# Patient Record
Sex: Female | Born: 1935 | Race: White | Hispanic: No | Marital: Married | Smoking: Never smoker
Health system: Southern US, Community
[De-identification: ages and names within clinical notes are randomized; demographics above are authoritative.]

## PROBLEM LIST (undated history)

## (undated) DIAGNOSIS — I480 Paroxysmal atrial fibrillation: Secondary | ICD-10-CM

## (undated) DIAGNOSIS — E119 Type 2 diabetes mellitus without complications: Secondary | ICD-10-CM

## (undated) DIAGNOSIS — E785 Hyperlipidemia, unspecified: Secondary | ICD-10-CM

## (undated) DIAGNOSIS — M1990 Unspecified osteoarthritis, unspecified site: Secondary | ICD-10-CM

## (undated) DIAGNOSIS — Z789 Other specified health status: Secondary | ICD-10-CM

## (undated) DIAGNOSIS — I340 Nonrheumatic mitral (valve) insufficiency: Secondary | ICD-10-CM

## (undated) DIAGNOSIS — C189 Malignant neoplasm of colon, unspecified: Secondary | ICD-10-CM

## (undated) DIAGNOSIS — F419 Anxiety disorder, unspecified: Secondary | ICD-10-CM

## (undated) DIAGNOSIS — C50919 Malignant neoplasm of unspecified site of unspecified female breast: Secondary | ICD-10-CM

## (undated) DIAGNOSIS — I2510 Atherosclerotic heart disease of native coronary artery without angina pectoris: Secondary | ICD-10-CM

## (undated) DIAGNOSIS — I10 Essential (primary) hypertension: Secondary | ICD-10-CM

## (undated) HISTORY — DX: Malignant neoplasm of unspecified site of unspecified female breast: C50.919

## (undated) HISTORY — DX: Paroxysmal atrial fibrillation: I48.0

## (undated) HISTORY — DX: Anxiety disorder, unspecified: F41.9

## (undated) HISTORY — PX: APPENDECTOMY: SHX54

## (undated) HISTORY — DX: Other specified health status: Z78.9

## (undated) HISTORY — PX: TONSILLECTOMY: SHX5217

## (undated) HISTORY — DX: Malignant neoplasm of colon, unspecified: C18.9

## (undated) HISTORY — PX: CHOLECYSTECTOMY: SHX55

## (undated) HISTORY — DX: Hyperlipidemia, unspecified: E78.5

## (undated) HISTORY — DX: Type 2 diabetes mellitus without complications: E11.9

## (undated) HISTORY — PX: TONSILLECTOMY: SUR1361

## (undated) HISTORY — DX: Essential (primary) hypertension: I10

## (undated) HISTORY — DX: Atherosclerotic heart disease of native coronary artery without angina pectoris: I25.10

## (undated) HISTORY — PX: OTHER SURGICAL HISTORY: SHX169

## (undated) HISTORY — PX: CATARACT EXTRACTION W/ INTRAOCULAR LENS  IMPLANT, BILATERAL: SHX1307

## (undated) HISTORY — DX: Nonrheumatic mitral (valve) insufficiency: I34.0

---

## 1986-02-25 DIAGNOSIS — C50919 Malignant neoplasm of unspecified site of unspecified female breast: Secondary | ICD-10-CM

## 1986-02-25 HISTORY — DX: Malignant neoplasm of unspecified site of unspecified female breast: C50.919

## 1986-02-25 HISTORY — PX: BREAST LUMPECTOMY: SHX2

## 1989-02-25 DIAGNOSIS — C189 Malignant neoplasm of colon, unspecified: Secondary | ICD-10-CM

## 1989-02-25 HISTORY — DX: Malignant neoplasm of colon, unspecified: C18.9

## 1989-02-25 HISTORY — PX: COLON SURGERY: SHX602

## 2002-11-01 ENCOUNTER — Emergency Department (HOSPITAL_COMMUNITY): Admission: EM | Admit: 2002-11-01 | Discharge: 2002-11-01 | Payer: Self-pay | Admitting: Emergency Medicine

## 2002-11-08 ENCOUNTER — Ambulatory Visit (HOSPITAL_COMMUNITY): Admission: RE | Admit: 2002-11-08 | Discharge: 2002-11-08 | Payer: Self-pay | Admitting: Internal Medicine

## 2002-11-15 ENCOUNTER — Encounter: Payer: Self-pay | Admitting: Internal Medicine

## 2002-11-15 ENCOUNTER — Ambulatory Visit (HOSPITAL_COMMUNITY): Admission: RE | Admit: 2002-11-15 | Discharge: 2002-11-15 | Payer: Self-pay | Admitting: Internal Medicine

## 2003-02-26 HISTORY — PX: CARDIAC CATHETERIZATION: SHX172

## 2003-06-07 ENCOUNTER — Inpatient Hospital Stay (HOSPITAL_COMMUNITY): Admission: AD | Admit: 2003-06-07 | Discharge: 2003-06-08 | Payer: Self-pay | Admitting: Cardiology

## 2004-06-22 ENCOUNTER — Ambulatory Visit: Payer: Self-pay | Admitting: Cardiology

## 2007-03-26 ENCOUNTER — Ambulatory Visit: Payer: Self-pay | Admitting: Cardiology

## 2007-04-05 ENCOUNTER — Ambulatory Visit: Payer: Self-pay | Admitting: Cardiology

## 2007-04-27 ENCOUNTER — Encounter: Payer: Self-pay | Admitting: Cardiology

## 2007-05-04 ENCOUNTER — Encounter: Payer: Self-pay | Admitting: Physician Assistant

## 2007-05-04 ENCOUNTER — Ambulatory Visit: Payer: Self-pay | Admitting: Cardiology

## 2007-05-07 ENCOUNTER — Ambulatory Visit: Payer: Self-pay | Admitting: Cardiology

## 2007-05-08 ENCOUNTER — Ambulatory Visit: Payer: Self-pay | Admitting: Cardiology

## 2007-05-08 ENCOUNTER — Encounter: Payer: Self-pay | Admitting: Cardiology

## 2007-05-13 ENCOUNTER — Ambulatory Visit: Payer: Self-pay | Admitting: Cardiology

## 2007-05-20 ENCOUNTER — Ambulatory Visit: Payer: Self-pay | Admitting: Cardiology

## 2007-05-27 ENCOUNTER — Ambulatory Visit: Payer: Self-pay | Admitting: Cardiology

## 2007-06-01 ENCOUNTER — Ambulatory Visit: Payer: Self-pay | Admitting: Cardiology

## 2007-06-29 ENCOUNTER — Ambulatory Visit: Payer: Self-pay | Admitting: Cardiology

## 2007-07-27 ENCOUNTER — Ambulatory Visit: Payer: Self-pay | Admitting: Cardiology

## 2007-08-25 ENCOUNTER — Ambulatory Visit: Payer: Self-pay | Admitting: Cardiology

## 2007-09-22 ENCOUNTER — Ambulatory Visit: Payer: Self-pay | Admitting: Cardiology

## 2007-10-21 ENCOUNTER — Ambulatory Visit: Payer: Self-pay | Admitting: Cardiology

## 2007-11-24 ENCOUNTER — Ambulatory Visit: Payer: Self-pay | Admitting: Cardiology

## 2007-12-08 ENCOUNTER — Ambulatory Visit: Payer: Self-pay | Admitting: Cardiology

## 2007-12-29 ENCOUNTER — Ambulatory Visit: Payer: Self-pay | Admitting: Cardiology

## 2008-01-29 ENCOUNTER — Ambulatory Visit: Payer: Self-pay | Admitting: Cardiology

## 2008-02-23 ENCOUNTER — Ambulatory Visit: Payer: Self-pay | Admitting: Cardiology

## 2008-03-25 ENCOUNTER — Ambulatory Visit: Payer: Self-pay | Admitting: Cardiology

## 2008-04-15 ENCOUNTER — Encounter: Payer: Self-pay | Admitting: Physician Assistant

## 2008-04-15 ENCOUNTER — Ambulatory Visit: Payer: Self-pay | Admitting: Cardiology

## 2008-04-22 ENCOUNTER — Ambulatory Visit: Payer: Self-pay | Admitting: Cardiology

## 2008-05-06 ENCOUNTER — Ambulatory Visit: Payer: Self-pay | Admitting: Cardiology

## 2008-05-10 ENCOUNTER — Encounter: Payer: Self-pay | Admitting: Cardiology

## 2008-06-07 ENCOUNTER — Ambulatory Visit: Payer: Self-pay | Admitting: Cardiology

## 2008-06-24 ENCOUNTER — Ambulatory Visit: Payer: Self-pay | Admitting: Cardiology

## 2008-07-19 ENCOUNTER — Ambulatory Visit: Payer: Self-pay | Admitting: Cardiology

## 2008-08-09 ENCOUNTER — Ambulatory Visit: Payer: Self-pay | Admitting: Cardiology

## 2008-09-02 ENCOUNTER — Ambulatory Visit: Payer: Self-pay | Admitting: Cardiology

## 2008-09-30 ENCOUNTER — Ambulatory Visit: Payer: Self-pay | Admitting: Cardiology

## 2008-10-10 ENCOUNTER — Encounter: Payer: Self-pay | Admitting: *Deleted

## 2008-10-26 ENCOUNTER — Ambulatory Visit: Payer: Self-pay | Admitting: Cardiology

## 2008-11-22 ENCOUNTER — Ambulatory Visit: Payer: Self-pay | Admitting: Cardiology

## 2008-11-22 LAB — CONVERTED CEMR LAB: POC INR: 2.5

## 2008-12-20 ENCOUNTER — Ambulatory Visit: Payer: Self-pay | Admitting: Cardiology

## 2009-01-13 ENCOUNTER — Ambulatory Visit: Payer: Self-pay | Admitting: Cardiology

## 2009-02-07 ENCOUNTER — Ambulatory Visit: Payer: Self-pay | Admitting: Cardiology

## 2009-02-07 LAB — CONVERTED CEMR LAB: POC INR: 2.6

## 2009-03-10 ENCOUNTER — Ambulatory Visit: Payer: Self-pay | Admitting: Cardiology

## 2009-03-10 LAB — CONVERTED CEMR LAB: POC INR: 2.8

## 2009-04-11 ENCOUNTER — Ambulatory Visit: Payer: Self-pay | Admitting: Cardiology

## 2009-04-25 ENCOUNTER — Ambulatory Visit: Payer: Self-pay | Admitting: Cardiology

## 2009-04-25 DIAGNOSIS — Z8679 Personal history of other diseases of the circulatory system: Secondary | ICD-10-CM | POA: Insufficient documentation

## 2009-04-25 DIAGNOSIS — E119 Type 2 diabetes mellitus without complications: Secondary | ICD-10-CM

## 2009-04-25 DIAGNOSIS — I10 Essential (primary) hypertension: Secondary | ICD-10-CM

## 2009-04-25 DIAGNOSIS — E785 Hyperlipidemia, unspecified: Secondary | ICD-10-CM

## 2009-05-09 ENCOUNTER — Ambulatory Visit: Payer: Self-pay | Admitting: Cardiology

## 2009-06-05 ENCOUNTER — Encounter: Payer: Self-pay | Admitting: Cardiology

## 2009-06-06 ENCOUNTER — Ambulatory Visit: Payer: Self-pay | Admitting: Cardiology

## 2009-06-06 LAB — CONVERTED CEMR LAB: POC INR: 2.8

## 2009-07-04 ENCOUNTER — Ambulatory Visit: Payer: Self-pay | Admitting: Cardiology

## 2009-07-13 ENCOUNTER — Encounter: Payer: Self-pay | Admitting: Cardiology

## 2009-08-01 ENCOUNTER — Ambulatory Visit: Payer: Self-pay | Admitting: Cardiology

## 2009-08-25 ENCOUNTER — Ambulatory Visit: Payer: Self-pay | Admitting: Cardiology

## 2009-09-22 ENCOUNTER — Ambulatory Visit: Payer: Self-pay | Admitting: Cardiology

## 2009-09-22 LAB — CONVERTED CEMR LAB: POC INR: 2.9

## 2009-10-20 ENCOUNTER — Ambulatory Visit: Payer: Self-pay | Admitting: Cardiology

## 2009-10-20 LAB — CONVERTED CEMR LAB: POC INR: 2.7

## 2009-10-31 ENCOUNTER — Telehealth (INDEPENDENT_AMBULATORY_CARE_PROVIDER_SITE_OTHER): Payer: Self-pay | Admitting: *Deleted

## 2009-10-31 ENCOUNTER — Encounter: Payer: Self-pay | Admitting: Cardiology

## 2009-10-31 ENCOUNTER — Ambulatory Visit: Payer: Self-pay | Admitting: Cardiovascular Disease

## 2009-10-31 ENCOUNTER — Emergency Department (HOSPITAL_COMMUNITY): Admission: EM | Admit: 2009-10-31 | Discharge: 2009-10-31 | Payer: Self-pay | Admitting: Emergency Medicine

## 2009-11-08 ENCOUNTER — Encounter (INDEPENDENT_AMBULATORY_CARE_PROVIDER_SITE_OTHER): Payer: Self-pay | Admitting: *Deleted

## 2009-11-17 ENCOUNTER — Ambulatory Visit: Payer: Self-pay | Admitting: Cardiology

## 2009-11-17 LAB — CONVERTED CEMR LAB: POC INR: 3.3

## 2009-12-15 ENCOUNTER — Ambulatory Visit: Payer: Self-pay | Admitting: Cardiology

## 2009-12-15 LAB — CONVERTED CEMR LAB: POC INR: 3.2

## 2010-01-09 ENCOUNTER — Encounter: Admission: RE | Admit: 2010-01-09 | Discharge: 2010-01-09 | Payer: Self-pay | Admitting: Otolaryngology

## 2010-01-12 ENCOUNTER — Ambulatory Visit: Payer: Self-pay | Admitting: Cardiology

## 2010-01-12 LAB — CONVERTED CEMR LAB: POC INR: 2.9

## 2010-02-09 ENCOUNTER — Ambulatory Visit: Payer: Self-pay | Admitting: Cardiology

## 2010-03-08 ENCOUNTER — Ambulatory Visit: Admission: RE | Admit: 2010-03-08 | Discharge: 2010-03-08 | Payer: Self-pay | Source: Home / Self Care

## 2010-03-17 ENCOUNTER — Encounter: Payer: Self-pay | Admitting: Otolaryngology

## 2010-03-27 NOTE — Assessment & Plan Note (Signed)
Summary: 1 yr fu per feb reminder-srs   Visit Type:  Follow-up Primary Provider:  Dimas Aguas  CC:  follow-up visit.  History of Present Illness: the patient is an 77 year old female with a history of paroxysmal atrial fibrillation and atrial flutter.  The patient is on Coumadin therapy.shows normal LV function and nonobstructive coronary artery disease.  She had a normal adenosine Cardiolite study with an ejection fraction is 64% in March of 2009.  The patient has diabetes and hypertension.  The patient is intolerant to numerous statins as well as edea.  The patient status has occasional palpitations but there very brief in duration.  She denies any chest pain shortness of breath orthopnea PND she reports no dizziness presyncope or syncope.  The patient reports a Dr. Dimas Aguas falls her blood work including her cholesterol panel.  Preventive Screening-Counseling & Management  Alcohol-Tobacco     Smoking Status: never  Current Medications (verified): 1)  Diltiazem Hcl Er Beads 180 Mg Xr24h-Cap (Diltiazem Hcl Er Beads) .... Take 1 Tablet By Mouth Once A Day 2)  Coumadin 2.5 Mg Tabs (Warfarin Sodium) .... Take 1 Tablet By Mouth As Directed 3)  Glipizide 5 Mg Xr24h-Tab (Glipizide) .... Take 1 Tablet By Mouth Once A Day 4)  Triamterene-Hctz 37.5-25 Mg Tabs (Triamterene-Hctz) .... Take 1 Tablet By Mouth Once A Day 5)  Diazepam 5 Mg Tabs (Diazepam) .... Take 1 Tablet By Mouth Once A Day 6)  Vitamin D3 400 Unit Tabs (Cholecalciferol) .... Take 1 Tablet By Mouth Once A Day  Allergies (verified): 1)  ! Accupril 2)  ! Bactrim 3)  ! Epinephrine 4)  ! Inderal 5)  ! Pcn 6)  ! Xanax 7)  ! Zithromax 8)  ! * Polysorin 9)  ! Lipitor (Atorvastatin) 10)  ! * Statins 11)  ! Cephalexin  Comments:  Nurse/Medical Assistant: The patient's medications and allergies were reviewed with the patient and were updated in the Medication and Allergy Lists. Bottles reviewed.  Clinical Review Panels:  Cardiac  Imaging Cardiac Cath Findings nonobstructive coronary artery disease; 20% distal CFX normal left ventricular function (06/08/2003)    Past History:  Past Medical History: Last updated: 04/15/2008 paroxysmal atrial fibrillation chronic Coumadin A.  CHAD2: 3 hypertension type 2 diabetes mellitus normal left ventricular function mild mitral regurgitation nonobstructive coronary artery disease dyslipidemia A.  intolerant to numerous statins, and Zetia breast cancer, 1988 A.  status post right lumpectomy colon cancer, 1991 A.  status post right hemicolectomy generalized anxiety disorder  Past Surgical History: Last updated: 04/15/2008 status post right hemicolectomy, 1991 status post right breast lumpectomy, 1988 status post laparoscopic cholecystectomy status post status post tonsillectomy status post bilateral intraocular lens implant  Family History: Last updated: 04/15/2008 Family History of Coronary Artery Disease:   Social History: Last updated: 04/15/2008 Tobacco Use - No.  Alcohol Use - no  Risk Factors: Smoking Status: never (04/25/2009)  Review of Systems       The patient complains of palpitations.  The patient denies fatigue, malaise, fever, weight gain/loss, vision loss, decreased hearing, hoarseness, chest pain, shortness of breath, prolonged cough, wheezing, sleep apnea, coughing up blood, abdominal pain, blood in stool, nausea, vomiting, diarrhea, heartburn, incontinence, blood in urine, muscle weakness, joint pain, leg swelling, rash, skin lesions, headache, fainting, dizziness, depression, anxiety, enlarged lymph nodes, easy bruising or bleeding, and environmental allergies.    Vital Signs:  Patient profile:   77 year old female Height:      59 inches Weight:  126 pounds BMI:     25.54 Pulse rate:   62 / minute BP sitting:   135 / 75  (left arm) Cuff size:   regular  Vitals Entered By: Carlye Grippe (April 25, 2009 11:03 AM)  Nutrition  Counseling: Patient's BMI is greater than 25 and therefore counseled on weight management options. CC: follow-up visit   Physical Exam  Additional Exam:  General: Well-developed, well-nourished in no distress head: Normocephalic and atraumatic eyes PERRLA/EOMI intact, conjunctiva and lids normal nose: No deformity or lesions mouth normal dentition, normal posterior pharynx neck: Supple, no JVD.  No masses, thyromegaly or abnormal cervical nodes lungs: Normal breath sounds bilaterally without wheezing.  Normal percussion heart: regular rate and rhythm with normal S1 and S2, no S3 or S4.  PMI is normal.  No pathological murmurs abdomen: Normal bowel sounds, abdomen is soft and nontender without masses, organomegaly or hernias noted.  No hepatosplenomegaly musculoskeletal: Back normal, normal gait muscle strength and tone normal pulsus: Pulse is normal in all 4 extremities Extremities: No peripheral pitting edema neurologic: Alert and oriented x 3 skin: Intact without lesions or rashes cervical nodes: No significant adenopathy psychologic: Normal affect this   EKG  Procedure date:  04/25/2009  Findings:      normal sinus rhythm.  Normal EKG.  Heart rate 61 beats/min.  Impression & Recommendations:  Problem # 1:  ATRIAL FIBRILLATION, PAROXYSMAL, HX OF (ICD-V12.50) the patient has a history of paroxysmal atrial fibrillation.  She reports rare palpitations.  I recommended to increase her Cardizem CD 180 mg p.o. daily.  Problem # 2:  COUMADIN THERAPY (ICD-V58.61) the patient is a high risk for thromboembolic events.she is on Coumadin  Problem # 3:  DM (ICD-250.00) followed by the patient's primary care physician Her updated medication list for this problem includes:    Glipizide 5 Mg Xr24h-tab (Glipizide) .Marland Kitchen... Take 1 tablet by mouth once a day  Problem # 4:  ESSENTIAL HYPERTENSION, BENIGN (ICD-401.1) controlled on current medical regimen Her updated medication list for this  problem includes:    Diltiazem Hcl Er Beads 180 Mg Xr24h-cap (Diltiazem hcl er beads) .Marland Kitchen... Take 1 tablet by mouth once a day    Triamterene-hctz 37.5-25 Mg Tabs (Triamterene-hctz) .Marland Kitchen... Take 1 tablet by mouth once a day  Problem # 5:  DYSLIPIDEMIA (ICD-272.4) the patient is intolerant to numerous statins including Zetia.  Her lipid panel is followed by Dr. Dimas Aguas. The following medications were removed from the medication list:    Gemfibrozil 600 Mg Tabs (Gemfibrozil) .Marland Kitchen... Take 1 tablet by mouth two times a day  Other Orders: EKG w/ Interpretation (93000)  Patient Instructions: 1)  Increase Cardizem CD to 180mg  daily 2)  Follow up in  1 year. Prescriptions: DILTIAZEM HCL ER BEADS 180 MG XR24H-CAP (DILTIAZEM HCL ER BEADS) Take 1 tablet by mouth once a day  #30 x 6   Entered by:   Hoover Brunette, LPN   Authorized by:   Lewayne Bunting, MD, [facility]   Signed by:   Hoover Brunette, LPN on 16/11/9602   Method used:   Electronically to        [facility] (retail)       [address]       [place], [place]  [address]       Ph: [phone]       Fax: [phone]   RxID:   [id number]

## 2010-03-27 NOTE — Medication Information (Signed)
Summary: ccr-lr  Anticoagulant Therapy  Managed by: Vashti Hey, RN PCP: Sandrea Hughs MD: Andee Lineman MD, Michelle Piper Indication 1: Atrial Fibrillation (ICD-427.31) [facility]  Site: [place] INR POC 2.4  Dietary changes: no    Health status changes: no    Bleeding/hemorrhagic complications: no    Recent/future hospitalizations: yes       Details: scheduled for colonoscopy on 08/17/09  Any changes in medication regimen? no    Recent/future dental: no  Any missed doses?: yes     Details: pt scheduled to stop coumadin 5 days before procedure  Is patient compliant with meds? yes       Allergies: 1)  ! Accupril 2)  ! Bactrim 3)  ! Epinephrine 4)  ! Inderal 5)  ! Pcn 6)  ! Xanax 7)  ! Zithromax 8)  ! * Polysorin 9)  ! Lipitor (Atorvastatin) 10)  ! * Statins 11)  ! Cephalexin  Anticoagulation Management History:      The patient is taking warfarin and comes in today for a routine follow up visit.  Positive risk factors for bleeding include an age of 34 years or older and presence of serious comorbidities.  The bleeding index is 'intermediate risk'.  Positive CHADS2 values include History of HTN, Age > 53 years old, and History of Diabetes.  The start date was 05/04/2007.  Anticoagulation responsible provider: Andee Lineman MD, Michelle Piper.  INR POC: 2.4.  Cuvette Lot#: [id number].  Exp: 10/11.    Anticoagulation Management Assessment/Plan:      The patient's current anticoagulation dose is Coumadin 2.5 mg tabs: Take 1 tablet by mouth as directed.  The target INR is 2 - 3.  The next INR is due 08/25/2009.  Anticoagulation instructions were given to patient.  Results were reviewed/authorized by Vashti Hey, RN.  She was notified by Vashti Hey RN.         Prior Anticoagulation Instructions: INR 2.7 Continue coumadin 2.5mg  once daily except 5mg  on Mondays, Wednesdays and Fridays Planning appt with Dr Karilyn Cota for colonoscopy.  Pt will call when it is scheduled.  Current  Anticoagulation Instructions: INR 2.4 Continue coumadin 2.5mg  once daily except 5mg  on M,W,F Stop coumadin on 08/12/09 for colonoscopy and resume night of procedure if OK with Dr Karilyn Cota

## 2010-03-27 NOTE — Medication Information (Signed)
Summary: ccr-lr  Anticoagulant Therapy  Managed by: Vashti Hey, RN PCP: Sandrea Hughs MD: Andee Lineman MD, Michelle Piper Indication 1: Atrial Fibrillation (ICD-427.31) [facility] [place] Site: [place] INR POC 2.8  Dietary changes: no    Health status changes: no    Bleeding/hemorrhagic complications: no    Recent/future hospitalizations: no    Any changes in medication regimen? no    Recent/future dental: no  Any missed doses?: no       Is patient compliant with meds? yes       Allergies: 1)  ! Accupril 2)  ! Bactrim 3)  ! Epinephrine 4)  ! Inderal 5)  ! Pcn 6)  ! Xanax 7)  ! Zithromax 8)  ! * Polysorin 9)  ! Lipitor (Atorvastatin) 10)  ! * Statins 11)  ! Cephalexin  Anticoagulation Management History:      The patient is taking warfarin and comes in today for a routine follow up visit.  Positive risk factors for bleeding include an age of 75 years or older and presence of serious comorbidities.  The bleeding index is 'intermediate risk'.  Positive CHADS2 values include History of HTN, Age > 5 years old, and History of Diabetes.  The start date was 05/04/2007.  Anticoagulation responsible provider: Andee Lineman MD, Michelle Piper.  INR POC: 2.8.  Cuvette Lot#: [id number].  Exp: 10/11.    Anticoagulation Management Assessment/Plan:      The patient's current anticoagulation dose is Coumadin 2.5 mg tabs: Take 1 tablet by mouth as directed.  The target INR is 2 - 3.  The next INR is due 07/04/2009.  Anticoagulation instructions were given to patient.  Results were reviewed/authorized by Vashti Hey, RN.  She was notified by Vashti Hey RN.         Prior Anticoagulation Instructions: INR 2.5 Continue coumadin 2.5mg  once daily except 5mg  on M,W,F  Current Anticoagulation Instructions: INR 2.8 Continue coumadin 2.5mg  once daily except 5mg  on M,W,F

## 2010-03-27 NOTE — Medication Information (Signed)
Summary: ccr-lr  Anticoagulant Therapy  Managed by: Vashti Hey, RN Supervising MD: Antoine Poche MD, Fayrene Fearing Indication 1: Atrial Fibrillation (ICD-427.31) [facility] [place] Site: [place] INR POC 2.1  Dietary changes: no    Health status changes: no    Bleeding/hemorrhagic complications: no    Recent/future hospitalizations: no    Any changes in medication regimen? no    Recent/future dental: no  Any missed doses?: no       Is patient compliant with meds? yes       Anticoagulation Management History:      The patient is taking warfarin and comes in today for a routine follow up visit.  Positive risk factors for bleeding include an age of 75 years or older.  The bleeding index is 'intermediate risk'.  Positive CHADS2 values include Age > 44 years old.  The start date was 05/04/2007.  Anticoagulation responsible provider: Antoine Poche MD, Fayrene Fearing.  INR POC: 2.1.  Cuvette Lot#: [id number].  Exp: 10/11.    Anticoagulation Management Assessment/Plan:      The patient's current anticoagulation dose is Coumadin 2.5 mg tabs: Take 1 tablet by mouth as directed.  The target INR is 2 - 3.  The next INR is due 05/09/2009.  Anticoagulation instructions were given to patient.  Results were reviewed/authorized by Vashti Hey, RN.  She was notified by Vashti Hey RN.         Prior Anticoagulation Instructions: INR 2.8 Continue coumadin 2.5mg  once daily except 5mg  onh M,W,F  Current Anticoagulation Instructions: INR 2.1 Continue coumadin 2.5mg  once daily except 5mg  on M,W,F

## 2010-03-27 NOTE — Procedures (Signed)
Summary: Holter and Event/ [facility] END OF SERVICE SUMMARY REPORT  Holter and Event/ [facility] END OF SERVICE SUMMARY REPORT   Imported By: Dorise Hiss 04/24/2009 15:32:14  _____________________________________________________________________  External Attachment:    Type:   Image     Comment:   External Document

## 2010-03-27 NOTE — Medication Information (Signed)
Summary: ccr-lr  Anticoagulant Therapy  Managed by: Vashti Hey, RN PCP: Sandrea Hughs MD: Andee Lineman MD, Michelle Piper Indication 1: Atrial Fibrillation (ICD-427.31) Lab [facility] [place] Site: [place] INR POC 3.2  Dietary changes: no    Health status changes: no    Bleeding/hemorrhagic complications: no    Recent/future hospitalizations: no    Any changes in medication regimen? no    Recent/future dental: no  Any missed doses?: no       Is patient compliant with meds? yes       Allergies: 1)  ! Accupril 2)  ! Bactrim 3)  ! Epinephrine 4)  ! Inderal 5)  ! Pcn 6)  ! Xanax 7)  ! Zithromax 8)  ! * Polysorin 9)  ! Lipitor (Atorvastatin) 10)  ! * Statins 11)  ! Cephalexin  Anticoagulation Management History:      The patient is taking warfarin and comes in today for a routine follow up visit (77).  Positive risk factors for bleeding include an age of 75 years or older and presence of serious comorbidities.  The bleeding index is 'intermediate risk'.  Positive CHADS2 values include History of HTN, Age > 75 years old, and History of Diabetes.  The start date was 05/04/2007.  Anticoagulation responsible Khila Papp: Andee Lineman MD, Michelle Piper.  INR POC: 3.2.  Cuvette Lot#: [id number].  Exp: 10/11.    Anticoagulation Management Assessment/Plan:      The patient's current anticoagulation dose is Coumadin 2.5 mg tabs: Take 1 tablet by mouth as directed.  The target INR is 2 - 3.  The next INR is due 01/12/2010.  Anticoagulation instructions were given to patient.  Results were reviewed/authorized by Vashti Hey, RN.  She was notified by Vashti Hey RN.         Prior Anticoagulation Instructions: INR 3.3 Take coumdin 1 tablet tonight then resume 2.5mg  once daily except 5mg  on M,W,F  Current Anticoagulation Instructions: INR 3.2 Take coumadin 2.5mg  tonight then resume 2.5mg  once daily except 5mg  on M,W,F

## 2010-03-27 NOTE — Medication Information (Signed)
Summary: ccr-lr  Anticoagulant Therapy  Managed by: Vashti Hey, RN PCP: Sandrea Hughs MD: Andee Lineman MD, Michelle Piper Indication 1: Atrial Fibrillation (ICD-427.31) [facility] [place] Site: [place] INR POC 2.5  Dietary changes: no    Health status changes: no    Bleeding/hemorrhagic complications: no    Recent/future hospitalizations: no    Any changes in medication regimen? no    Recent/future dental: no  Any missed doses?: no       Is patient compliant with meds? yes       Allergies: 1)  ! Accupril 2)  ! Bactrim 3)  ! Epinephrine 4)  ! Inderal 5)  ! Pcn 6)  ! Xanax 7)  ! Zithromax 8)  ! * Polysorin 9)  ! Lipitor (Atorvastatin) 10)  ! * Statins 11)  ! Cephalexin  Anticoagulation Management History:      The patient is taking warfarin and comes in today for a routine follow up visit.  Positive risk factors for bleeding include an age of 79 years or older and presence of serious comorbidities.  The bleeding index is 'intermediate risk'.  Positive CHADS2 values include History of HTN, Age > 3 years old, and History of Diabetes.  The start date was 05/04/2007.  Anticoagulation responsible provider: Andee Lineman MD, Michelle Piper.  INR POC: 2.5.  Cuvette Lot#: [id number].  Exp: 10/11.    Anticoagulation Management Assessment/Plan:      The patient's current anticoagulation dose is Coumadin 2.5 mg tabs: Take 1 tablet by mouth as directed.  The target INR is 2 - 3.  The next INR is due 06/06/2009.  Anticoagulation instructions were given to patient.  Results were reviewed/authorized by Vashti Hey, RN.  She was notified by Vashti Hey RN.         Prior Anticoagulation Instructions: INR 2.1 Continue coumadin 2.5mg  once daily except 5mg  on M,W,F  Current Anticoagulation Instructions: INR 2.5 Continue coumadin 2.5mg  once daily except 5mg  on M,W,F Prescriptions: COUMADIN 2.5 MG TABS (WARFARIN SODIUM) Take 1 tablet by mouth as directed  #60 x 3   Entered by:   Vashti Hey RN  Authorized by:   Lewayne Bunting, MD, [facility]   Signed by:   Vashti Hey RN on 05/09/2009   Method used:   Electronically to        [facility] (retail)       [address]       [place], [place]  [address]       Ph: [phone]       Fax: [phone]   RxID:   [id number]

## 2010-03-27 NOTE — Letter (Signed)
Summary: External Correspondence/ OFFICE VISIT DR. HOWARD  External Correspondence/ OFFICE VISIT DR. HOWARD   Imported By: Dorise Hiss 06/06/2009 16:50:47  _____________________________________________________________________  External Attachment:    Type:   Image     Comment:   External Document

## 2010-03-27 NOTE — Medication Information (Signed)
Summary: ccr-lr  Anticoagulant Therapy  Managed by: Vashti Hey, RN PCP: Sandrea Hughs MD: Andee Lineman MD, Michelle Piper Indication 1: Atrial Fibrillation (ICD-427.31) [facility] [place] Site: [place] INR POC 2.7  Dietary changes: no    Health status changes: no    Bleeding/hemorrhagic complications: no    Recent/future hospitalizations: no    Any changes in medication regimen? no    Recent/future dental: no  Any missed doses?: no       Is patient compliant with meds? yes       Allergies: 1)  ! Accupril 2)  ! Bactrim 3)  ! Epinephrine 4)  ! Inderal 5)  ! Pcn 6)  ! Xanax 7)  ! Zithromax 8)  ! * Polysorin 9)  ! Lipitor (Atorvastatin) 10)  ! * Statins 11)  ! Cephalexin  Anticoagulation Management History:      The patient is taking warfarin and comes in today for a routine follow up visit.  Positive risk factors for bleeding include an age of 75 years or older and presence of serious comorbidities.  The bleeding index is 'intermediate risk'.  Positive CHADS2 values include History of HTN, Age > 69 years old, and History of Diabetes.  The start date was 05/04/2007.  Anticoagulation responsible provider: Andee Lineman MD, Michelle Piper.  INR POC: 2.7.  Cuvette Lot#: [id number].  Exp: 10/11.    Anticoagulation Management Assessment/Plan:      The patient's current anticoagulation dose is Coumadin 2.5 mg tabs: Take 1 tablet by mouth as directed.  The target INR is 2 - 3.  The next INR is due 08/01/2009.  Anticoagulation instructions were given to patient.  Results were reviewed/authorized by Vashti Hey, RN.  She was notified by Vashti Hey RN.         Prior Anticoagulation Instructions: INR 2.8 Continue coumadin 2.5mg  once daily except 5mg  on M,W,F  Current Anticoagulation Instructions: INR 2.7 Continue coumadin 2.5mg  once daily except 5mg  on Mondays, Wednesdays and Fridays Planning appt with Dr Karilyn Cota for colonoscopy.  Pt will call when it is scheduled.

## 2010-03-27 NOTE — Medication Information (Signed)
Summary: ccr-lr  Anticoagulant Therapy  Managed by: Vashti Hey, RN PCP: Sandrea Hughs MD: Myrtis Ser MD, Tinnie Gens Indication 1: Atrial Fibrillation (ICD-427.31) Lab [facility] [place] Site: [place] INR POC 2.7  Dietary changes: no    Health status changes: no    Bleeding/hemorrhagic complications: no    Recent/future hospitalizations: no    Any changes in medication regimen? no    Recent/future dental: no  Any missed doses?: yes     Details: missed 1 dose 2 weeks ago  Is patient compliant with meds? yes       Allergies: 1)  ! Accupril 2)  ! Bactrim 3)  ! Epinephrine 4)  ! Inderal 5)  ! Pcn 6)  ! Xanax 7)  ! Zithromax 8)  ! * Polysorin 9)  ! Lipitor (Atorvastatin) 10)  ! * Statins 11)  ! Cephalexin  Anticoagulation Management History:      The patient is taking warfarin and comes in today for a routine follow up visit.  Positive risk factors for bleeding include an age of 25 years or older and presence of serious comorbidities.  The bleeding index is 'intermediate risk'.  Positive CHADS2 values include History of HTN, Age > 37 years old, and History of Diabetes.  The start date was 05/04/2007.  Anticoagulation responsible Kamaria Lucia: Myrtis Ser MD, Tinnie Gens.  INR POC: 2.7.  Cuvette Lot#: [id number].  Exp: 10/11.    Anticoagulation Management Assessment/Plan:      The patient's current anticoagulation dose is Coumadin 2.5 mg tabs: Take 1 tablet by mouth as directed.  The target INR is 2 - 3.  The next INR is due 11/17/2009.  Anticoagulation instructions were given to patient.  Results were reviewed/authorized by Vashti Hey, RN.  She was notified by Vashti Hey RN.         Prior Anticoagulation Instructions: INR 2.9 Continue coumadin 2.5mg  once daily except 5mg  on M,W,F  Current Anticoagulation Instructions: INR 2.7 Continue coumadin 2.5mg  once daily except 5mg  on M,W,F

## 2010-03-27 NOTE — Medication Information (Signed)
Summary: ccr-lr  Anticoagulant Therapy  Managed by: Vashti Hey, RN PCP: Sandrea Hughs MD: Antoine Poche MD, Fayrene Fearing Indication 1: Atrial Fibrillation (ICD-427.31) Lab [facility] [place] Site: [place] INR POC 2.3  Dietary changes: no    Health status changes: no    Bleeding/hemorrhagic complications: no    Recent/future hospitalizations: no    Any changes in medication regimen? no    Recent/future dental: no  Any missed doses?: no       Is patient compliant with meds? yes       Allergies: 1)  ! Accupril 2)  ! Bactrim 3)  ! Epinephrine 4)  ! Inderal 5)  ! Pcn 6)  ! Xanax 7)  ! Zithromax 8)  ! * Polysorin 9)  ! Lipitor (Atorvastatin) 10)  ! * Statins 11)  ! Cephalexin  Anticoagulation Management History:      The patient is taking warfarin and comes in today for a routine follow up visit.  Positive risk factors for bleeding include an age of 64 years or older and presence of serious comorbidities.  The bleeding index is 'intermediate risk'.  Positive CHADS2 values include History of HTN, Age > 81 years old, and History of Diabetes.  The start date was 05/04/2007.  Anticoagulation responsible provider: Antoine Poche MD, Fayrene Fearing.  INR POC: 2.3.  Cuvette Lot#: [id number].  Exp: 10/11.    Anticoagulation Management Assessment/Plan:      The patient's current anticoagulation dose is Coumadin 2.5 mg tabs: Take 1 tablet by mouth as directed.  The target INR is 2 - 3.  The next INR is due 09/22/2009.  Anticoagulation instructions were given to patient.  Results were reviewed/authorized by Vashti Hey, RN.  She was notified by Vashti Hey RN.         Prior Anticoagulation Instructions: INR 2.4 Continue coumadin 2.5mg  once daily except 5mg  on M,W,F Stop coumadin on 08/12/09 for colonoscopy and resume night of procedure if OK with Dr Karilyn Cota  Current Anticoagulation Instructions: INR 2.3 Continue coumadin 2.5mg  once daily except 5mg  on M,W,F

## 2010-03-27 NOTE — Medication Information (Signed)
Summary: ccr-lr  Anticoagulant Therapy  Managed by: Vashti Hey, RN PCP: Sandrea Hughs MD: Andee Lineman MD, Michelle Piper Indication 1: Atrial Fibrillation (ICD-427.31) Lab [facility] [place] Site: [place] INR POC 3.3  Dietary changes: no    Health status changes: no    Bleeding/hemorrhagic complications: no    Recent/future hospitalizations: no    Any changes in medication regimen? no    Recent/future dental: no  Any missed doses?: no       Is patient compliant with meds? yes       Allergies: 1)  ! Accupril 2)  ! Bactrim 3)  ! Epinephrine 4)  ! Inderal 5)  ! Pcn 6)  ! Xanax 7)  ! Zithromax 8)  ! * Polysorin 9)  ! Lipitor (Atorvastatin) 10)  ! * Statins 11)  ! Cephalexin  Anticoagulation Management History:      The patient is taking warfarin and comes in today for a routine follow up visit.  Positive risk factors for bleeding include an age of 75 years or older and presence of serious comorbidities.  The bleeding index is 'intermediate risk'.  Positive CHADS2 values include History of HTN, Age > 46 years old, and History of Diabetes.  The start date was 05/04/2007.  Anticoagulation responsible provider: Andee Lineman MD, Michelle Piper.  INR POC: 3.3.  Cuvette Lot#: [id number].  Exp: 10/11.    Anticoagulation Management Assessment/Plan:      The patient's current anticoagulation dose is Coumadin 2.5 mg tabs: Take 1 tablet by mouth as directed.  The target INR is 2 - 3.  The next INR is due 12/15/2009.  Anticoagulation instructions were given to patient.  Results were reviewed/authorized by Vashti Hey, RN.  She was notified by Vashti Hey RN.         Prior Anticoagulation Instructions: INR 2.7 Continue coumadin 2.5mg  once daily except 5mg  on M,W,F  Current Anticoagulation Instructions: INR 3.3 Take coumdin 1 tablet tonight then resume 2.5mg  once daily except 5mg  on M,W,F

## 2010-03-27 NOTE — Medication Information (Signed)
Summary: ccr-lr  Anticoagulant Therapy  Managed by: Vashti Hey, RN Supervising MD: Diona Browner MD, Remi Deter Indication 1: Atrial Fibrillation (ICD-427.31) Lab [facility] [place] [facility]: [place] INR POC 2.8  Dietary changes: no    Health status changes: no    Bleeding/hemorrhagic complications: no    Recent/future hospitalizations: no    Any changes in medication regimen? no    Recent/future dental: no  Any missed doses?: no       Is patient compliant with meds? yes       Anticoagulation Management History:      The patient is taking warfarin and comes in today for a routine follow up visit.  Positive risk factors for bleeding include an age of 75 years or older.  The bleeding index is 'intermediate risk'.  Positive CHADS2 values include Age > 34 years old.  The start date was 05/04/2007.  Anticoagulation responsible provider: Diona Browner MD, Remi Deter.  INR POC: 2.8.  Cuvette Lot#: [id number].  Exp: 10/11.    Anticoagulation Management Assessment/Plan:      The patient's current anticoagulation dose is Coumadin 2.5 mg tabs: Take 1 tablet by mouth as directed.  The target INR is 2 - 3.  The next INR is due 04/11/2009.  Anticoagulation instructions were given to patient.  Results were reviewed/authorized by Vashti Hey, RN.  She was notified by Vashti Hey RN.         Prior Anticoagulation Instructions: INR 2.6 Continue coumadin 2.5mg  once daily except 5mg  on M,W,F  Current Anticoagulation Instructions: INR 2.8 Continue coumadin 2.5mg  once daily except 5mg  onh M,W,F

## 2010-03-27 NOTE — Consult Note (Signed)
Summary: Judith Chung   Impact   Imported By: Roderic Ovens 12/20/2009 12:27:45  _____________________________________________________________________  External Attachment:    Type:   Image     Comment:   External Document

## 2010-03-27 NOTE — Medication Information (Signed)
Summary: ccr-lr  Anticoagulant Therapy  Managed by: Vashti Hey, RN PCP: Sandrea Hughs MD: Diona Browner MD, Remi Deter Indication 1: Atrial Fibrillation (ICD-427.31) Lab [facility] Site: [place] INR POC 2.9  Dietary changes: no    Health status changes: no    Bleeding/hemorrhagic complications: no    Recent/future hospitalizations: no    Any changes in medication regimen? no    Recent/future dental: no  Any missed doses?: no       Is patient compliant with meds? yes       Allergies: 1)  ! Accupril 2)  ! Bactrim 3)  ! Epinephrine 4)  ! Inderal 5)  ! Pcn 6)  ! Xanax 7)  ! Zithromax 8)  ! * Polysorin 9)  ! Lipitor (Atorvastatin) 10)  ! * Statins 11)  ! Cephalexin  Anticoagulation Management History:      The patient is taking warfarin and comes in today for a routine follow up visit.  Positive risk factors for bleeding include an age of 75 years or older and presence of serious comorbidities.  The bleeding index is 'intermediate risk'.  Positive CHADS2 values include History of HTN, Age > 51 years old, and History of Diabetes.  The start date was 05/04/2007.  Anticoagulation responsible provider: Diona Browner MD, Remi Deter.  INR POC: 2.9.  Cuvette Lot#: [id number].  Exp: 10/11.    Anticoagulation Management Assessment/Plan:      The patient's current anticoagulation dose is Coumadin 2.5 mg tabs: Take 1 tablet by mouth as directed.  The target INR is 2 - 3.  The next INR is due 10/20/2009.  Anticoagulation instructions were given to patient.  Results were reviewed/authorized by Vashti Hey, RN.  She was notified by Vashti Hey RN.         Prior Anticoagulation Instructions: INR 2.3 Continue coumadin 2.5mg  once daily except 5mg  on M,W,F  Current Anticoagulation Instructions: INR 2.9 Continue coumadin 2.5mg  once daily except 5mg  on M,W,F

## 2010-03-27 NOTE — Letter (Signed)
Summary: Appointment- Rescheduled   [facility] at [facility] [address]   [place], [place] [address]   Phone: [phone]  Fax: [phone]     November 08, 2009 MRN: [id number]     Judith Chung [address] [facility] [address] [place]  [address]     Dear [name],   Due to a change in our office schedule, your appointment time on   Sept 23, 2011 at 11:30 must be changed to Sept 23, 2011 at 3:00.   We look forward to participating in your health care needs.       Sincerely,  Glass blower/designer

## 2010-03-27 NOTE — Progress Notes (Signed)
Summary: PAIN IN ARM GOING TO NECKW ITH HAND NUMBNESS  Phone Note Call from Patient Call back at Home Phone [phone]   Caller: Patient Reason for Call: Talk to Nurse Details for Reason: PAIN IN ARM TO NECK Summary of Call: PATIENT HAS LEFT 2 MESSAGES THAT I HAVE FOWARDED TO GAYLE IN REFERENCE TO HER NOT FEELING WELL.  SHE WOKE AT 5:45 WITH PAIN IN HER ARM RADIATING TO HER NECK.  STATED THAT HER HAND IS NUMB AND THAT SHE IS NOT FEELING WELL AT ALL.Marland Kitchen  Initial call taken by: Claudette Laws,  October 31, 2009 9:27 AM  Follow-up for Phone Call        Spoke with patient.  States both hands and arms still do not feel right and still have some pain in left side of neck.  Feels more numbness in left hand and arm.  Advised her to go to ED for evaluation.  She stated that she did not want to go to [place].   She did state that her husband is there with her.  Advised her that if she did not feel comfortable going to [place], that she could go to [facility] and that if she did not feel safe going by car - she could call EMS to take her.  Patient verbalized understanding.  Follow-up by: Hoover Brunette, LPN,  October 31, 2009 9:42 AM

## 2010-03-27 NOTE — Letter (Signed)
Summary: Letter/ FAXED [facility]  Letter/ FAXED [facility]   Imported By: Dorise Hiss 07/14/2009 12:17:30  _____________________________________________________________________  External Attachment:    Type:   Image     Comment:   External Document

## 2010-03-27 NOTE — Medication Information (Signed)
Summary: ccr-lr  Anticoagulant Therapy  Managed by: Judith Hey, RN PCP: Judith Chung: Judith Chung, Michelle Piper Indication 1: Atrial Fibrillation (ICD-427.31) Lab [facility] [place] [facility]: [place] INR POC 2.9  Dietary changes: no    Health status changes: no    Bleeding/hemorrhagic complications: no    Recent/future hospitalizations: no    Any changes in medication regimen? no    Recent/future dental: no  Any missed doses?: no       Is patient compliant with meds? yes       Allergies: 1)  ! Accupril 2)  ! Bactrim 3)  ! Epinephrine 4)  ! Inderal 5)  ! Pcn 6)  ! Xanax 7)  ! Zithromax 8)  ! * Polysorin 9)  ! Lipitor (Atorvastatin) 10)  ! * Statins 11)  ! Cephalexin  Anticoagulation Management History:      The patient is taking warfarin and comes in today for a routine follow up visit.  Positive risk factors for bleeding include an age of 75 years or older and presence of serious comorbidities.  The bleeding index is 'intermediate risk'.  Positive CHADS2 values include History of HTN, Age > 25 years old, and History of Diabetes.  The start date was 05/04/2007.  Anticoagulation responsible Linzee Depaul: Judith Chung, Michelle Piper.  INR POC: 2.9.  Cuvette Lot#: [id number].  Exp: 10/11.    Anticoagulation Management Assessment/Plan:      The patient's current anticoagulation dose is Coumadin 2.5 mg tabs: Take 1 tablet by mouth as directed.  The target INR is 2 - 3.  The next INR is due 02/09/2010.  Anticoagulation instructions were given to patient.  Results were reviewed/authorized by Judith Hey, RN.  She was notified by Judith Hey RN.         Prior Anticoagulation Instructions: INR 3.2 Take coumadin 2.5mg  tonight then resume 2.5mg  once daily except 5mg  on M,W,F  Current Anticoagulation Instructions: INR 2.9 Continue coumadin 2.5mg  once daily except 5mg  on M,W,F

## 2010-03-29 NOTE — Medication Information (Signed)
Summary: ccr-lr  Anticoagulant Therapy  Managed by: Vashti Hey, RN PCP: Sandrea Hughs MD: Andee Lineman MD, Michelle Piper Indication 1: Atrial Fibrillation (ICD-427.31) Lab [facility]: [facility] [place] Site: [place] INR POC 3.0  Dietary changes: no    Health status changes: no    Bleeding/hemorrhagic complications: no    Recent/future hospitalizations: no    Any changes in medication regimen? no    Recent/future dental: no  Any missed doses?: no       Is patient compliant with meds? yes       Allergies: 1)  ! Accupril 2)  ! Bactrim 3)  ! Epinephrine 4)  ! Inderal 5)  ! Pcn 6)  ! Xanax 7)  ! Zithromax 8)  ! * Polysorin 9)  ! Lipitor (Atorvastatin) 10)  ! * Statins 11)  ! Cephalexin  Anticoagulation Management History:      The patient is taking warfarin and comes in today for a routine follow up visit.  Positive risk factors for bleeding include an age of 75 years or older and presence of serious comorbidities.  The bleeding index is 'intermediate risk'.  Positive CHADS2 values include History of HTN, Age > 79 years old, and History of Diabetes.  The start date was 05/04/2007.  Anticoagulation responsible provider: Andee Lineman MD, Michelle Piper.  INR POC: 3.0.  Cuvette Lot#: [id number].  Exp: 10/11.    Anticoagulation Management Assessment/Plan:      The patient's current anticoagulation dose is Coumadin 2.5 mg tabs: Take 1 tablet by mouth as directed.  The target INR is 2 - 3.  The next INR is due 03/08/2010.  Anticoagulation instructions were given to patient.  Results were reviewed/authorized by Vashti Hey, RN.  She was notified by Vashti Hey RN.         Prior Anticoagulation Instructions: INR 2.9 Continue coumadin 2.5mg  once daily except 5mg  on M,W,F  Current Anticoagulation Instructions: INR 3.0 Continue coumadin 2.5mg  once daily except 5mg  on M,W,F

## 2010-03-29 NOTE — Medication Information (Signed)
Summary: ccr-lr  Anticoagulant Therapy  Managed by: Judith Hey, RN PCP: Judith Chung: Judith Chung, Judith Chung Indication 1: Atrial Fibrillation (ICD-427.31) Lab [facility] [place] Site: [place] INR POC 3.2  Dietary changes: no    Health status changes: no    Bleeding/hemorrhagic complications: no    Recent/future hospitalizations: no    Any changes in medication regimen? no    Recent/future dental: no  Any missed doses?: no       Is patient compliant with meds? yes       Allergies: 1)  ! Accupril 2)  ! Bactrim 3)  ! Epinephrine 4)  ! Inderal 5)  ! Pcn 6)  ! Xanax 7)  ! Zithromax 8)  ! * Polysorin 9)  ! Lipitor (Atorvastatin) 10)  ! * Statins 11)  ! Cephalexin  Anticoagulation Management History:      The patient is taking warfarin and comes in today for a routine follow up visit.  Positive risk factors for bleeding include an age of 75 years or older and presence of serious comorbidities.  The bleeding index is 'intermediate risk'.  Positive CHADS2 values include History of HTN, Age > 75 years old, and History of Diabetes.  The start date was 05/04/2007.  Anticoagulation responsible provider: Diona Browner Chung, Judith Chung.  INR POC: 3.2.  Cuvette Lot#: [id number].  Exp: 10/11.    Anticoagulation Management Assessment/Plan:      The patient's current anticoagulation dose is Coumadin 2.5 mg tabs: Take 1 tablet by mouth as directed.  The target INR is 2 - 3.  The next INR is due 04/06/2010.  Anticoagulation instructions were given to patient.  Results were reviewed/authorized by Judith Hey, RN.  She was notified by Judith Hey RN.         Prior Anticoagulation Instructions: INR 3.0 Continue coumadin 2.5mg  once daily except 5mg  on M,W,F  Current Anticoagulation Instructions: INR 3.2 Take coumadin 1/2 tablet tonight then decrease dose to 2.5mg  once daily except 5mg  on M,F

## 2010-04-06 ENCOUNTER — Encounter (INDEPENDENT_AMBULATORY_CARE_PROVIDER_SITE_OTHER): Payer: Medicare Other

## 2010-04-06 ENCOUNTER — Encounter: Payer: Self-pay | Admitting: Cardiology

## 2010-04-06 DIAGNOSIS — I4891 Unspecified atrial fibrillation: Secondary | ICD-10-CM

## 2010-04-06 DIAGNOSIS — Z7901 Long term (current) use of anticoagulants: Secondary | ICD-10-CM

## 2010-04-06 LAB — CONVERTED CEMR LAB: POC INR: 2.2

## 2010-04-12 NOTE — Medication Information (Signed)
Summary: ccr-lr  Lab Visit  Orders Today:  Anticoagulant Therapy  Managed by: Vashti Hey, RN PCP: Sandrea Hughs MD: Diona Browner MD, Remi Deter Indication 1: Atrial Fibrillation (ICD-427.31) Lab [facility] [place] Site: [place] INR POC 2.2  Dietary changes: no    Health status changes: no    Bleeding/hemorrhagic complications: no    Recent/future hospitalizations: no    Any changes in medication regimen? no    Recent/future dental: no  Any missed doses?: no       Is patient compliant with meds? yes         Anticoagulation Management History:      The patient is taking warfarin and comes in today for a routine follow up visit.  Positive risk factors for bleeding include an age of 75 years or older and presence of serious comorbidities.  The bleeding index is 'intermediate risk'.  Positive CHADS2 values include History of HTN, Age > 75 years old, and History of Diabetes.  The start date was 05/04/2007.  Anticoagulation responsible provider: Diona Browner MD, Remi Deter.  INR POC: 2.2.  Cuvette Lot#: [id number].  Exp: 10/11.    Anticoagulation Management Assessment/Plan:      The patient's current anticoagulation dose is Coumadin 2.5 mg tabs: Take 1 tablet by mouth as directed.  The target INR is 2 - 3.  The next INR is due 05/04/2010.  Anticoagulation instructions were given to patient.  Results were reviewed/authorized by Vashti Hey, RN.  She was notified by Vashti Hey RN.         Prior Anticoagulation Instructions: INR 3.2 Take coumadin 1/2 tablet tonight then decrease dose to 2.5mg  once daily except 5mg  on M,F  Current Anticoagulation Instructions: INR 2.2 Continue coumadin 2.5mg  once daily except 5mg  on Mondays and Fridays

## 2010-05-04 ENCOUNTER — Encounter (INDEPENDENT_AMBULATORY_CARE_PROVIDER_SITE_OTHER): Payer: Medicare Other

## 2010-05-04 ENCOUNTER — Encounter: Payer: Self-pay | Admitting: Cardiology

## 2010-05-04 DIAGNOSIS — Z7901 Long term (current) use of anticoagulants: Secondary | ICD-10-CM

## 2010-05-04 DIAGNOSIS — I4891 Unspecified atrial fibrillation: Secondary | ICD-10-CM

## 2010-05-08 NOTE — Medication Information (Signed)
Summary: ccr-lr  Anticoagulant Therapy  Managed by: Judith Hey, RN PCP: Judith Chung: Andee Lineman Chung, Michelle Piper Indication 1: Atrial Fibrillation (ICD-427.31) Lab [facility]: [facility] [place] Site: [place] INR POC 2.7  Dietary changes: no    Health status changes: no    Bleeding/hemorrhagic complications: no    Recent/future hospitalizations: no    Any changes in medication regimen? no    Recent/future dental: no  Any missed doses?: no       Is patient compliant with meds? yes       Allergies: 1)  ! Accupril 2)  ! Bactrim 3)  ! Epinephrine 4)  ! Inderal 5)  ! Pcn 6)  ! Xanax 7)  ! Zithromax 8)  ! * Polysorin 9)  ! Lipitor (Atorvastatin) 10)  ! * Statins 11)  ! Cephalexin  Anticoagulation Management History:      The patient is taking warfarin and comes in today for a routine follow up visit.  Positive risk factors for bleeding include an age of 75 years or older and presence of serious comorbidities.  The bleeding index is 'intermediate risk'.  Positive CHADS2 values include History of HTN, Age > 23 years old, and History of Diabetes.  The start date was 05/04/2007.  Anticoagulation responsible provider: Andee Lineman Chung, Michelle Piper.  INR POC: 2.7.  Cuvette Lot#: [id number].  Exp: 10/11.    Anticoagulation Management Assessment/Plan:      The patient's current anticoagulation dose is Coumadin 2.5 mg tabs: Take 1 tablet by mouth as directed.  The target INR is 2 - 3.  The next INR is due 06/05/2010.  Anticoagulation instructions were given to patient.  Results were reviewed/authorized by Judith Hey, RN.  She was notified by Judith Hey RN.         Prior Anticoagulation Instructions: INR 2.2 Continue coumadin 2.5mg  once daily except 5mg  on Mondays and Fridays  Current Anticoagulation Instructions: INR 2.7 Continue coumadin 2.5mg  once daily except 5mg  on Mondays and Fridays

## 2010-05-10 LAB — BASIC METABOLIC PANEL
BUN: 22 mg/dL (ref 6–23)
CO2: 25 mEq/L (ref 19–32)
Chloride: 105 mEq/L (ref 96–112)
Glucose, Bld: 128 mg/dL — ABNORMAL HIGH (ref 70–99)
Potassium: 3.5 mEq/L (ref 3.5–5.1)

## 2010-05-10 LAB — DIFFERENTIAL
Basophils Absolute: 0.1 10*3/uL (ref 0.0–0.1)
Basophils Relative: 1 % (ref 0–1)
Eosinophils Relative: 2 % (ref 0–5)
Monocytes Absolute: 0.5 10*3/uL (ref 0.1–1.0)
Monocytes Relative: 8 % (ref 3–12)

## 2010-05-10 LAB — CBC
HCT: 39.6 % (ref 36.0–46.0)
MCH: 30.6 pg (ref 26.0–34.0)
MCHC: 33.6 g/dL (ref 30.0–36.0)
MCV: 91.2 fL (ref 78.0–100.0)
RDW: 13.8 % (ref 11.5–15.5)

## 2010-05-30 ENCOUNTER — Other Ambulatory Visit: Payer: Self-pay | Admitting: Cardiology

## 2010-06-04 ENCOUNTER — Encounter: Payer: Self-pay | Admitting: Cardiology

## 2010-06-04 DIAGNOSIS — Z8679 Personal history of other diseases of the circulatory system: Secondary | ICD-10-CM

## 2010-06-04 DIAGNOSIS — Z7901 Long term (current) use of anticoagulants: Secondary | ICD-10-CM

## 2010-06-05 ENCOUNTER — Ambulatory Visit (INDEPENDENT_AMBULATORY_CARE_PROVIDER_SITE_OTHER): Payer: Medicare Other | Admitting: *Deleted

## 2010-06-05 DIAGNOSIS — Z7901 Long term (current) use of anticoagulants: Secondary | ICD-10-CM

## 2010-06-05 DIAGNOSIS — Z8679 Personal history of other diseases of the circulatory system: Secondary | ICD-10-CM

## 2010-06-05 DIAGNOSIS — I4891 Unspecified atrial fibrillation: Secondary | ICD-10-CM

## 2010-06-28 ENCOUNTER — Other Ambulatory Visit: Payer: Self-pay | Admitting: Cardiology

## 2010-06-29 ENCOUNTER — Other Ambulatory Visit: Payer: Self-pay | Admitting: *Deleted

## 2010-06-29 DIAGNOSIS — I4891 Unspecified atrial fibrillation: Secondary | ICD-10-CM

## 2010-06-29 MED ORDER — DILTIAZEM HCL ER COATED BEADS 180 MG PO CP24: 180.0000 mg | ORAL_CAPSULE | Freq: Every day | ORAL | Status: DC

## 2010-07-03 ENCOUNTER — Ambulatory Visit (INDEPENDENT_AMBULATORY_CARE_PROVIDER_SITE_OTHER): Payer: Medicare Other | Admitting: *Deleted

## 2010-07-03 DIAGNOSIS — Z8679 Personal history of other diseases of the circulatory system: Secondary | ICD-10-CM

## 2010-07-03 DIAGNOSIS — I4891 Unspecified atrial fibrillation: Secondary | ICD-10-CM

## 2010-07-03 DIAGNOSIS — Z7901 Long term (current) use of anticoagulants: Secondary | ICD-10-CM

## 2010-07-10 NOTE — Assessment & Plan Note (Signed)
[facility]                          [facility] OFFICE NOTE   Judith Chung, Judith Chung                     MRN:          [id number]  DATE:05/04/2007                            DOB:          Jan 21, 1925    PRIMARY CARDIOLOGIST:  Learta Codding, MD.   REASON FOR VISIT:  Post hospital followup.   HISTORY:  Judith Chung presents to our office for post hospital followup,  after recent evaluation for probable, recurrent paroxysmal atrial  fibrillation.  She was briefly hospitalized here at [facility] in late  January, at which time she presented with recurrent tachy palpitations,  which reportedly spontaneously converted to NSR before she received any  medical therapy.  There was no documented dysrhythmia during her brief  stay and we, therefore, suggested further evaluation as an outpatient  with CardioNet monitoring.  This was just completed 2 days ago.  We also  checked a 2-D echo and found continued preserved left ventricular  function.   Medications were adjusted with substitution of Norvasc by Diltiazem for  better rate control, as well as prophylaxis against recurrent PAF.  We  also assessed her with a CHADII score of 3, secondary to hypertension,  diabetes and age.  It was also noted that these episodes are always less  than 24 hours in duration.   Review of CardioNet monitoring today does indicate brief episodes of  atrial fibrillation/flutter (less than 10 seconds), with no significant  post-termination pauses.  Clinically, the patient has not had any  recurrent strong sensation of tachy palpitations, but has noted  occasional palpitations.   Also of note, the patient complains of some anterior chest discomfort  which appears to be correlated with moderate exertion.  She did have  some associated chest pain when she presented to the emergency room, but  did rule out with negative cardiac markers.   DIAGNOSTICS:  Electrocardiogram today reveals  NSR at 64 bpm with normal  axis and nonspecific ST changes; voltage criteria for LVH.   CURRENT MEDICATIONS:  1. Glipizide ER 5 daily.  2. Diltiazem CD 120 daily.  3. Maxzide 37.5/25 mg daily.  4. Gemfibrozil 1600 mg b.i.d.   PHYSICAL EXAMINATION:  VITAL SIGNS:  Blood pressure 141/75, pulse 68,  regular weight 129.6.  GENERAL:  An 77 year old female sitting upright in no distress.  HEENT:  Normocephalic, atraumatic.  NECK:  Palpable bilateral carotid pulses without bruits; no JVD at 90  degrees.  LUNGS:  Clear to auscultation in all fields.  HEART:  Regular rate and rhythm (S1 and S2).  No significant murmurs.  ABDOMEN:  Soft, nontender.  EXTREMITIES:  No edema.  NEURO:  No focal deficits.   IMPRESSION:  1. Recurrent paroxysmal atrial fibrillation/flutter.      a.     [place] II score 3.      b.     Remote history of paroxysmal atrial fibrillation (less than       24 hour duration).  2. Atypical chest pain.      a.     Nonobstructive coronary artery disease, by cardiac  catheterization in 2005.  3. Preserved left ventricular function.      a.     Ejection fraction 55 - 60% with mild left ventricular       hypertrophy, by recent 2-D echo.  4. Type 2 diabetes mellitus.  5. Hypertension.  6. Dyslipidemia.      a.     Intolerant to numerous statins, as well as Zetia.   PLAN:  1. Initiate Coumadin anticoagulation to minimize her risk of stroke.      The patient is agreeable with this plan and is willing to proceed.      We will continue aspirin, until she maintains a therapeutic INR      level.  2. Schedule a low-level adenosine stress Cardiolite for risk      stratification.  The patient has symptoms which are      typical/atypical for ischemic heart disease.  Given her several      cardiac risk factors, however, she may have developed some      significant CAD since her last study in 2005.  3. Schedule return clinic follow up with myself and Dr. Andee Chung in 1      month  for review of study results and further recommendations.      Judith Serpe, PA-C  Electronically Signed      Learta Codding, MD,FACC  Electronically Signed   GS/MedQ  DD: 05/04/2007  DT: 05/05/2007  Job #: [id number]   cc:   Judith Chung

## 2010-07-10 NOTE — Assessment & Plan Note (Signed)
[facility]                          [facility]ICE NOTE   Judith Chung, Judith Chung Judith Chung                     MRN:          [id number]  DATE:04/15/2008                            DOB:          December 07, 1924    PRIMARY CARDIOLOGIST:  Learta Codding, MD, [facility]   REASON FOR VISIT:  Annual followup.   Judith Chung continues to do extremely well from a cardiovascular  standpoint.  She denies any interim development of exertional angina  pectoris.  She has had only rare palpitations, of very brief duration,  and with no significant associated symptoms.   Electrocardiogram today indicates NSR at 64 bpm with nonspecific ST  changes.   CURRENT MEDICATIONS:  1. Coumadin, as directed.  2. Glipizide ER 5 daily.  3. Diltiazem CD 120 daily.  4. Triamterene/hydrochlorothiazide 37.5/25 mg daily.  5. Gemfibrozil 600 b.i.d.   PHYSICAL EXAMINATION:  VITAL SIGNS:  Blood pressure 134/72, pulse 64 and  regular, weight 126 (down 1 pound).  GENERAL:  An 77 year old female sitting upright in no distress.  HEENT:  Normocephalic, traumatic.  NECK:  Palpable bilateral carotid pulses without bruits; no JVD at 90  degrees.  LUNGS:  Clear to auscultation in all fields.  HEART:  Regular rate and rhythm.  No significant murmurs.  ABDOMEN:  Soft, nontender.  EXTREMITIES:  Intact pulses with no edema.  NEUROLOGIC:  No focal deficit.   IMPRESSION:  1. Paroxysmal atrial fibrillation/flutter.      a.     Maintaining normal sinus rhythm.  2. Chronic Coumadin.      a.     Followed in our [facility].  3. Normal left ventricular function.  4. Nonobstructive coronary artery disease.      a.     Normal adenosine Cardiolite; ejection fraction 64%, March       2009.  5. Type 2 diabetes mellitus.  6. Hypertension.  7. Dyslipidemia.      a.     Intolerant to numerous statins, as well as Zetia.   PLAN:  1. Continue current medication regimen.  2. Aggressive lipid management with  target LDL of 70 or less, if      feasible.  3. Schedule return clinic followup with myself and Dr. Andee Lineman in 1      year.      [name], PA-C  Electronically Signed      Learta Codding, MD,FACC  Electronically Signed   GS/MedQ  DD: 04/15/2008  DT: 04/16/2008  Job #: [id number]   cc:   Selinda Flavin

## 2010-07-10 NOTE — Assessment & Plan Note (Signed)
[facility]                          [facility]ICE NOTE   Judith, Witts KENLEE Chung                     MRN:          [id number]  DATE:06/01/2007                            DOB:          [birth year]    REASON FOR PRESENTATION:  Paroxysmal atrial fibrillation.   HISTORY OF PRESENT ILLNESS:  The patient is a lovely 77 year old white  female with paroxysmal atrial fibrillation. She has worn an event  monitor demonstrating brief runs of this. Most recently we sent her for  a stress perfusion study which demonstrated well-preserved ejection  fraction greater than 60% with no evidence of ischemia or infarct.   Since last being seen, she has had no new complaints. She rarely feels  palpitations. She has no presyncope or syncope. She has no chest pain or  shortness of breath. She remains active.   PAST MEDICAL HISTORY:  1. Nonobstructive coronary disease, catheterization April 2005.  2. Paroxysmal atrial fibrillation.  3. Well-preserved ejection fraction.  4. Type 2 diabetes mellitus.  5. Hypertension.  6. Dyslipidemia.   ALLERGIES AND INTOLERANCES:  ACCUPRIL, SULFA, INDERAL, EPINEPHRINE,  PENICILLIN, ZITHROMAX, POLYSPORIN, CEPHALEXIN, STATINS.   MEDICATIONS:  1. Glipizide 5 mg daily.  2. Diltiazem 120 mg daily.  3. Triamterene /hydrochlorothiazide 37.5/25 daily.  4. Gemfibrozil 600 mg b.i.d.  5. Vitamin C.  6. Calcium.  7. Coumadin.   REVIEW OF SYSTEMS:  As stated in the HPI. Otherwise, negative for other  systems.   PHYSICAL EXAMINATION:  GENERAL:  The patient is in no distress.  VITAL SIGNS:  Blood pressure 152/74, heart rate 67 and regular, weight  127.2 pounds.  HEENT:  Eyes: Unremarkable. Pupils equal, round, and reactive to light.  Fundi:  Not visualized.  NECK:  No jugular venous distention at 45-degrees,  carotid upstroke  brisk and symmetric. No bruits or thyromegaly.  LYMPHATICS:  No lymphadenopathy.  LUNGS:  Clear to  auscultation bilaterally.  BACK:  No costovertebral angle tenderness.  CHEST:  Unremarkable.  HEART:  PMI not displaced or sustained.  S1, S2 within normal limits. No  S3. No murmurs.  ABDOMEN:  Flat, positive bowel sounds normal in frequency and pitch. No  bruits, rebound or guarding.  No midline pulsatile mass or organomegaly.  SKIN:  No rashes or nodules  EXTREMITIES:  2+ pulses, no edema.   ASSESSMENT AND PLAN:  1. Atrial fibrillation. The patient has short paroxysms of this. She      is tolerating anticoagulation. No further cardiovascular testing is      suggested. She will remain on the Diltiazem and the Coumadin.  2. Hypertension. Her blood pressure is slightly elevated. However, it      has not been previously. Therefore, we will continue to follow      this, and she can be followed by Dr. Dimas Aguas for this. She should      keep a blood pressure diary at home.  3. Followup will be back in this clinic in 6 months or sooner if      needed.     Rollene Rotunda, MD, [facility]  Electronically Signed  JH/MedQ  DD: 06/01/2007  DT: 06/01/2007  Job #: [id number]   cc:   Selinda Flavin

## 2010-07-13 ENCOUNTER — Ambulatory Visit (INDEPENDENT_AMBULATORY_CARE_PROVIDER_SITE_OTHER): Payer: Medicare Other | Admitting: *Deleted

## 2010-07-13 DIAGNOSIS — I4891 Unspecified atrial fibrillation: Secondary | ICD-10-CM

## 2010-07-13 DIAGNOSIS — Z7901 Long term (current) use of anticoagulants: Secondary | ICD-10-CM

## 2010-07-13 DIAGNOSIS — Z8679 Personal history of other diseases of the circulatory system: Secondary | ICD-10-CM

## 2010-07-13 LAB — POCT INR: INR: 1.5

## 2010-07-13 NOTE — Op Note (Signed)
   NAME:  MALLORIE, NORROD                        ACCOUNT NO.:  [id number]   MEDICAL RECORD NO.:  [id number]                   PATIENT TYPE:  AMB   LOCATION:  DAY                                  FACILITY:  [facility]   PHYSICIAN:  R. Roetta Sessions, M.D.              DATE OF BIRTH:  [birth year]   DATE OF PROCEDURE:  DATE OF DISCHARGE:                                 OPERATIVE REPORT   PROCEDURE:  Diagnostic esophagogastroduodenoscopy.   ENDOSCOPIST:  Gerrit Friends. Rourk, M.D.   INDICATIONS FOR PROCEDURE:  The patient IS A 78 year old lady with  epigastric and right upper quadrant abdominal pain.  She has been evaluated  fairly extensively.  She was recently put back on Prevacid 30 mg daily with  resolution of her typical reflux symptoms.  She continues to have epigastric  and right upper quadrant pain.  EGD is now being done to rule out a mucosal  process in her upper GI tract which may be contributing to her symptoms.  I  feel that she has a chest wall component to some of her symptoms.   PROCEDURE NOTE:  O2 saturation, blood pressure, pulse and respirations were  monitored throughout the entire procedure.  Conscious sedation: Versed 3 mg  IV, Demerol 75 mg IV in divided doses, Cetacaine spray for topical  oropharyngeal anesthesia.   INSTRUMENT:  Olympus video chip adult gastroscope.   FINDINGS:  Examination of the tubular esophagus revealed no mucosal  abnormalities.  The EG junction was easily traversed.   STOMACH:  The gastric cavity was empty.  It insufflated well with air.  A  thorough examination of the gastric mucosa including a retroflex view of the  proximal stomach and esophagogastric junction demonstrated no abnormalities.  The pylorus was patent and easily traversed.   DUODENUM:  The bulb and the second portion appeared normal.   THERAPEUTIC/DIAGNOSTIC MANEUVERS:  None.   The patient tolerated the procedure well and was reacted in endoscopy.   IMPRESSION:  1. Normal  esophagus, stomach and D1 and D2.  2. No endoscopic explanation for her symptoms.    RECOMMENDATIONS:  1. Continue Prevacid 30 mg orally b.i.d.  2. We will revisit the gallbladder with a repeat HIDA with CCK injection at     [facility].  3. Further recommendations to follow.                                               Jonathon Bellows, M.D.    RMR/MEDQ  D:  11/08/2002  T:  11/08/2002  Job:  [id number]   cc:   Selinda Flavin  [address]  [place]  [place] [address]  Fax: [phone]

## 2010-07-13 NOTE — H&P (Signed)
NAME:  ASPIN, PALOMAREZ NO.:  [id number]   MEDICAL RECORD NO.:  [id number]                   PATIENT TYPE:   LOCATION:                                       FACILITY:   PHYSICIAN:  R. Roetta Sessions, M.D.              DATE OF BIRTH:  1924/11/30   DATE OF ADMISSION:  DATE OF DISCHARGE:                                HISTORY & PHYSICAL   PRIMARY CARE PHYSICIAN:  Dr. Selinda Flavin.   CHIEF COMPLAINT:  Abdominal pain.   HISTORY OF PRESENT ILLNESS:  Ms. Stryker is a 77 year old lady, a patient of  Dr. Selinda Flavin who presents today for further evaluation of abdominal  pain.  She has a several-year history of right upper quadrant abdominal pain  for which we have seen her in 2003.  She had workup at that time consisting  of HIDA scan, abdominal ultrasound, a CT of the abdomen, and an upper  endoscopy.  Upper endoscopy revealed a couple of tiny distal esophageal  erosions consistent with mild reflux esophagitis, two nodules in the antrum  which was benign.  She did have H. pylori and underwent treatment.  The  patient tells me that she has had intermittent symptoms since that time.  She has been on various PPI therapies and changes and she feels like they  are not working anymore.  She has been back on Prevacid now for about a week  and has had resolution of her typical heartburn symptoms.  She is  complaining of pain in the midepigastrium and right upper quadrant region.  It also radiates around the lower right rib cage margin around into her  back.  She generally has episodes like this a couple of times per year.  This one has been going on for eight weeks.  She denies any modifying or  alleviating factors.  Not necessarily related to meals although she tells me  she avoids fatty and fried foods as it makes her feel bad.  She describes  midepigastric pain as crampy in quality.  Denies any vomiting but has  frequent nausea.  Her bowels are moving regularly;  she takes Metamucil daily  for mild constipation.  Denies any melena or rectal bleeding, dysphagia,  odynophagia.  She has a history of colon cancer status post right  hemicolectomy in 1991.  She recently underwent CT of the chest for further  evaluation of this pain.  She was found to have a pulmonary nodular  infiltrate in the right middle lobe which was unchanged from February 2003  study.  She has slight aneurysm borderline dilatation of the distal  abdominal aorta, right renal cyst, but otherwise unremarkable study.  She  also had abdominopelvic CT which revealed benign-appearing calcifications of  the posterior right lobe of the liver, normal spleen, no abnormality seen of  the pancreas.  Lab work revealed normal LFTs and CBC.  Three days ago  she  went to the ED at [facility] because of unrelenting abdominal pain.  She tells me she was given morphine then Vicodin and was sent home.  Lab  work at that time revealed unremarkable CBC.  LFTs were normal except for a  mildly elevated SGPT of 49.   CURRENT MEDICATIONS:  1. Triamterene/hydrochlorothiazide 37.5/25 mg daily.  2. Glucotrol 5 mg daily.  3. Lanoxin 0.25 mg daily.  4. Norvasc 10 mg daily.  5. Valium 2 mg p.r.n.  6. Aspirin 81 mg daily.  7. Calcium 600 mg daily.  8. Multivitamin daily.  9. Tagamet 200 mg p.r.n.  10.      Vitamin E 200 international units daily.  11.      Vitamine C 500 international units daily.  12.      Mylanta two tablespoons daily.  13.      Prevacid 30 mg daily.  14.      Vicodin one q.6h. p.r.n.  15.      Metamucil daily.   ALLERGIES:  1. FLU SHOT caused angioedema of the lips.  2. INDERAL, POLYSPORIN, Z PAK, PENICILLIN all caused rash.  3. XANAX produced lethargy.  4. LIPITOR, CRESTOR, and ZETIA all caused stomach pain and muscle pain.  5. ACCUPRIL causes rash,  6. She gets chest pain with BACTRIM, SULFA, and EPINEPHRINE.   PAST MEDICAL HISTORY:  1. Type 2 diabetes mellitus.  2.  History of atrial fibrillation.  3. Hypercholesterolemia.  4. Hypertension.  5. Chronic right upper quadrant abdominal pain.  6. History of colon cancer status post right hemicolectomy in 1991.  7. History of right breast cancer status post lumpectomy with radiation in     1988.  8. Lens implant bilaterally in 2002.  9. Status post appendectomy, tonsillectomy, bladder tacking.   FAMILY HISTORY:  Mother and father both succumbed to MI.  There is no  history of chronic GI illnesses, liver disease, or colorectal neoplasia.   SOCIAL HISTORY:  She has been married for 53 years, has two sons.  She is  retired.  Denies any tobacco or alcohol use.   REVIEW OF SYSTEMS:  Please see HPI for GI.  GENERAL:  Denies any weight  loss.  CARDIOPULMONARY:  Denies any shortness of breath or chest pain.   PHYSICAL EXAMINATION:  VITAL SIGNS:  Weight 119, blood pressure 110/70,  pulse 68.  GENERAL:  Pleasant, well-nourished, well-developed Caucasian female in no  acute distress.  SKIN:  Warm and dry, no jaundice.  HEENT:  Conjunctivae are pink, sclerae nonicteric.  Oropharyngeal mucosa  moist and pink.  No lesions, erythema, or exudate.  No lymphadenopathy or  thyromegaly.  CHEST:  Lungs are clear to auscultation.  CARDIAC:  Reveals regular rate and rhythm, normal S1 and S2.  No murmurs,  rubs, or gallops.  ABDOMEN:  Positive bowel sounds, soft, nondistended.  She has moderate  tenderness in the midepigastric region as well as right upper quadrant and  right lower rib cage margin to palpation.  No rebound tenderness or  guarding.  She has prominent aortic pulsations.  No hepatosplenomegaly or  masses.  EXTREMITIES:  No edema.   IMPRESSION:  1. Mrs. Mallery is a 77 year old lady who presents with recurrent     midepigastrium pain as well as right upper quadrant/right lower rib cage     margin pain.  It is unclear whether these two pains are from the same    source.  She has had chronic right upper  quadrant pain for many years and     had a workup early 2003 for this as outlined above.  Current episode has     been going on for about eight weeks.  Does not seem to be necessarily     related to eating.  She is also having typical reflux symptoms which is     now better since starting Prevacid a week ago.  She is having some nausea     as well.  I have discussed with the patient today that we ought to     consider repeating upper endoscopy at this time to make sure that she has     not had any changes since prior study, i.e. development of peptic ulcer     disease.  If this study is normal then would consider repeating HIDA scan     to verify that she has not developed biliary dyskinesia.  Ultimately, I     feel that her right upper quadrant/right rib cage pain cannot be     explained by either peptic ulcer disease or biliary etiology.  She is     quite tender with palpation of the rib margin.  Wonder if she has had     some scarring related to prior radiation therapy and/or prior right     hemicolectomy that resulted in some of this pain.  2. History of colon cancer.  The patient is followed by Dr. Cleotis Nipper and     has had a colonoscopy within the last couple of years.   PLAN:  1. EGD in the near future.  If this is negative, consider repeating HIDA     scan with fatty meal challenge.  2. Continue Prevacid for now.  3. She will continue to use Vicodin as needed for pain.   I would like to thank Dr. Dimas Aguas for allowing [place] to take part in the care of  this patient.     Tana Coast, Pricilla Larsson, M.D.    LL/MEDQ  D:  11/04/2002  T:  11/04/2002  Job:  [id number]   cc:   Selinda Flavin  [address]  [place]  [place] [address]  Fax: [phone]

## 2010-07-13 NOTE — H&P (Signed)
NAME:  Judith Chung, Judith Chung NO.:  [id number]   MEDICAL RECORD NO.:  [id number]                   PATIENT TYPE:  INP   LOCATION:  3733                                 FACILITY:  [facility]   PHYSICIAN:  Learta Codding, M.D. [facility]             DATE OF BIRTH:  [birth year]   DATE OF ADMISSION:  06/07/2003  DATE OF DISCHARGE:                                HISTORY & PHYSICAL   REFERRING PHYSICIAN:  Dr. Dimas Aguas.   REASON FOR CONSULTATION:  Evaluation of a 77 year old female with multiple  cardiac risk factors with substernal chest pain of one week's duration.   HISTORY OF PRESENT ILLNESS:  Judith Chung is a 77 year old white female with  multiple cardiovascular risk factors, including a very strong family history  of coronary artery disease, which includes sudden cardiac death, and  diabetes mellitus.  The patient was referred by Dr. Dimas Aguas due to concerns  of chest pain which started approximately a week ago.  The patient states  that over the last five to seven days she has experienced severe substernal  chest pain with some radiation into the neck and jaw as well as in the left  cheek area.  She states that exertion definitely makes her pain worse,  although has occurred at rest; as a matter of fact, she had an episode this  morning of chest pressure with pain radiating down into the left arm.  The  patient reports significant weakness upon exertion, which has also been  going on for several weeks.  Last week she was doing some house renovation  and felt extremely fatigued and had several occurrences of chest pain.  She  tells me that this morning she had about two or three minutes of pain and  actually had a little pain just before walking into the office.  The patient  did have a stress test done two years ago, which was read as negative.  She  also had a catheterization done at the [facility] in [facility], which  reportedly was within normal limits although we do  not really have any  records on this.   ALLERGIES:  Multiple, including ACCUPRIL causes rash; BACTRIM, EPINEPHRINE,  AND LOCAL ANESTHESIA cause hypertension and chest pain; INDERAL causes rash;  PENICILLIN causes rash and swelling; XANAX, she gets too sleepy; ZITHROMAX  causes rash; POLYSPORIN causes rash; LIPITOR, CRESTOR, and ZETIA cause  stomach and muscle pain; CEPHALEXIN causes rash.   MEDICATIONS:  1. Triamterene/hydrochlorothiazide 37.5/25 mg a day.  2. Glucotrol XL 5 mg a day.  3. Lanoxin 0.25 mg p.o. daily.  4. Norvasc 10 mg a day.  5. Valium 2 mg as needed p.r.n.  6. Aspirin 81 mg one tablet a day.  7. Calcium 600 mg a day.  8. Multivitamin one a day.  9. Vitamin C.  10.      Tagamet HP 200.   PAST MEDICAL HISTORY:  1. Lumpectomy, right breast, 1988.  2. Colon cancer surgery.  3. Bladder surgery.  4. Cholecystectomy in 2004.  5. History of diabetes mellitus on oral hypoglycemic drugs.   SOCIAL HISTORY:  The patient is married.  She has two sons.  She does not  smoke or drink alcohol.   FAMILY HISTORY:  Notable for mother dying at age 42 from congestive heart  failure.  Father died of myocardial infarction at age 53 with sudden death.  She also has a sister 20 who has severe coronary artery disease.   REVIEW OF SYSTEMS:  As per HPI.  No nausea or vomiting.  NO fever or chills.  No orthopnea or PND.  No palpitations or syncope.  No claudication.   PHYSICAL EXAMINATION:  VITAL SIGNS:  Blood pressure 140/66, heart rate 80  beats per minute, weight is 117 pounds.  GENERAL:  Well-nourished white female in no apparent distress.  NECK:  Supple, normal carotid upstroke, no carotid bruits.  LUNGS:  Clear breath sounds bilaterally.  CARDIAC:  Regular rate and rhythm, normal S1 and S2.  Soft systolic murmur,  left upper sternal border.  PMI is nondisplaced.  ABDOMEN:  Soft, nontender, no rebound or guarding.  Good bowel sounds.  EXTREMITIES:  2+ peripheral pulses, no  cyanosis, clubbing, or edema.   A 12-lead EKG pending.   Labs pending.   IMPRESSION AND PLAN:  1. Unstable angina.  The patient has multiple risks factors for coronary     artery disease.  Her symptoms are very typical for unstable angina.  The     plan is to admit her today.  We will give her aspirin, Plavix, and start     her on nitroglycerin.  Unfortunately, due to the bed situation we have     asked the patient to wait until she is called from the hospital.  She     currently has no chest pain, and we feel we can manage her safely this     way as long as she comes into the ER immediately when she has an episode     of substernal chest pain that is not responsive to nitroglycerin.  The     patient will require cardiac catheterization, which will be scheduled for     tomorrow.  On admission to the  hospital, we will also start her on     Lovenox.  2. Diabetes mellitus.  Continue oral hypoglycemic drug therapy.  The patient     is not on Glucophage.  She can continue her Glucotrol XL 5 mg a day.  3. Hypertension, controlled.  4. Dyslipidemia.  The patient had allergy to multiple statin drugs.   DISPOSITION:  Arrangements have been made to admit the patient to the  hospital and proceed with a cardiac catheterization in the morning.  The  patient knows to come immediately to the emergency room if she has an  episode of substernal chest pain at rest.                                                Learta Codding, M.D. [facility]    GED/MEDQ  D:  06/07/2003  T:  06/07/2003  Job:  [id number]   cc:   Selinda Flavin  [address] [place]  [place]  [place] [address]  Fax: [phone]

## 2010-07-13 NOTE — Discharge Summary (Signed)
NAME:  Judith Chung, Judith Chung NO.:  [id number]   MEDICAL RECORD NO.:  [id number]                   PATIENT TYPE:  INP   LOCATION:  3703                                 FACILITY:  [facility]   PHYSICIAN:  Learta Codding, M.D. [facility]             DATE OF BIRTH:  Aug 29, 1924   DATE OF ADMISSION:  06/07/2003  DATE OF DISCHARGE:  06/08/2003                                 DISCHARGE SUMMARY   PROCEDURE:  1. Cardiac catheterization.  2. Coronary arteriogram.  3. Left ventriculogram.   HOSPITAL COURSE:  Judith Chung is a 77 year old female with no known history  of coronary artery disease. She has multiple cardiac risk factors including  family history of coronary artery disease, diabetes, and hypertension. She  saw Judith Chung because of chest pain that started approximately 5 to 7 days  prior to admission. There was radiation to her neck and jaw as well as to  her left cheek area. She has some symptoms with rest but most of them are  with exertion. She also complaints of weakness. She had a stress test 2  years ago which was negative. She was admitted for further evaluation and  treatment.   Her enzymes were negative for MI, but it was felt that cardiac  catheterization was indicated, and this was performed on June 08, 2003. The  cardiac catheterization showed no significant coronary artery disease. There  was approximately 20% stenosis in her circumflex. Her EF was normal at  greater than 65% with no MR. Judith Chung evaluated the films and felt that  noncardiac sources of chest pain should be evaluated.   As part of her evaluation, she had a chest CT, and it showed a nodular  density at one of the lung bases on the lateral view only. A chest CT was  felt indicated, but because of the dye load she had, it was felt that this  should be done as an outpatient.   Pending completion of her bed rest and ambulation without chest pain or  shortness of breath, Judith Chung is  considered stable for discharge on June 08, 2003 with outpatient followup arranged.   DISCHARGE CONDITION:  Stable.   DISCHARGE DIAGNOSES:  1. Chest pain, no significant coronary artery disease by catheterization.  2. Nodule on chest x-ray, chest as outpatient prior to office visit.  3. Hypertension.  4. Family history of premature coronary artery disease.  5. Diabetes.  6. Dyslipidemia with intolerance to multiple statins.  7. Allergies including ACCUPRIL, BACTRIM, EPINEPHRINE LOCAL ANESTHESIA,     INDERAL, PENICILLIN, XANAX, ZITHROMAX, __________, LIPITOR, CRESTOR,     ZETIA, and KEFLEX.   DISCHARGE INSTRUCTIONS:  Activity level was to include no driving, sexual,  or strenuous activity for two days. She is to stick to a low fat, diabetic  diet. She is to call the office with problems with the catheterization site.  She is to  get a chest CT prior to her office visit, and the office will  call. She has a followup appointment with Judith Chung for Judith Chung on  Wednesday, April 27 at 11:45 a.m.   DISCHARGE MEDICATIONS:  1. Triamterene hydrochlorothiazide 37.5/25 mg q.d.  2. Glucotrol XL 5 mg q.d.  3. Lanoxin 0.25 mg q.d.  4. Norvasc 10 mg q.d.  5. Valium p.r.n.  6. Aspirin 81 mg q.d.  7. Calcium, multivitamin, and vitamin C q.d.  8. Tagamet HP 200 q.d.      Theodore Demark, P.A. [facility]                  Learta Codding, M.D. [facility]    RB/MEDQ  D:  06/08/2003  T:  06/09/2003  Job:  [id number]   cc:   Selinda Flavin  [address]  [place]  [place] [address]  Fax: [phone]

## 2010-07-13 NOTE — Cardiovascular Report (Signed)
NAME:  Judith Chung, Judith Chung NO.:  [id number]   MEDICAL RECORD NO.:  [id number]                   PATIENT TYPE:  INP   LOCATION:  3703                                 FACILITY:  [facility]   PHYSICIAN:  Carole Binning, M.D. [facility]         DATE OF BIRTH:  27-Jun-1924   DATE OF PROCEDURE:  06/08/2003  DATE OF DISCHARGE:  06/08/2003                              CARDIAC CATHETERIZATION   PROCEDURE PERFORMED:  Left heart catheterization with coronary angiography  and left ventriculography.   INDICATION:  [name] is a 77 year old woman with multiple cardiac risk  factors including diabetes mellitus.  She presented to the office in [place]  with one week history of progressive substernal chest pain.  Resting EKG did  show some mild ST segment depression.  She was admitted to the hospital  where she ruled out for myocardial infarction.  She was subsequently  referred for cardiac catheterization.   CATHETERIZATION PROCEDURAL NOTE:  A 6 French sheath was placed in the right  femoral artery.  Coronary angiography was performed with standard Judkins 6  French catheters.  Left ventriculography was performed with an angled  pigtail catheter.  Contrast was Omnipaque.  There were no complications.   CATHETERIZATION RESULTS:   HEMODYNAMICS:  1. Left ventricular pressure 164/10.  2. Aortic pressure 154/74.  3. There is no aortic valve gradient.   LEFT VENTRICULOGRAM:  The left ventricle is hyperdynamic.  There are no wall  motion abnormalities.  Ejection fraction estimated at greater than or equal  to 65%.  There is no mitral regurgitation.   CORONARY ARTERIOGRAPHY (RIGHT DOMINANT):  Left main is normal.   Left anterior descending artery gives rise to small first and second  diagonal branches and a normal size third diagonal branch.  The LAD is very  tortuous, but free of atherosclerotic disease.   Left circumflex gives rise to a small first obtuse marginal, normal size  second obtuse marginal and normal size third obtuse marginal branch.  There  is a 20% stenosis in the distal circumflex just beyond the second obtuse  marginal branch.   Right coronary artery is a small caliber vessel giving rise to a small  posterior descending artery and a series of small posterior lateral  branches.  The right coronary artery is normal.   IMPRESSION:  1. Normal left ventricular systolic function.  2. No significant coronary artery disease identified.                                               Carole Binning, M.D. [facility]    MWP/MEDQ  D:  06/08/2003  T:  06/09/2003  Job:  [phone]   cc:   Selinda Flavin  [address]  [place]  [place] [address]  Fax: [phone]  [facility] in [place]

## 2010-07-27 ENCOUNTER — Ambulatory Visit (INDEPENDENT_AMBULATORY_CARE_PROVIDER_SITE_OTHER): Payer: Medicare Other | Admitting: *Deleted

## 2010-07-27 DIAGNOSIS — Z8679 Personal history of other diseases of the circulatory system: Secondary | ICD-10-CM

## 2010-07-27 DIAGNOSIS — Z7901 Long term (current) use of anticoagulants: Secondary | ICD-10-CM

## 2010-07-27 DIAGNOSIS — I4891 Unspecified atrial fibrillation: Secondary | ICD-10-CM

## 2010-07-27 LAB — POCT INR: INR: 1.9

## 2010-07-27 MED ORDER — WARFARIN SODIUM 2.5 MG PO TABS: 2.5000 mg | ORAL_TABLET | ORAL | Status: DC

## 2010-08-14 ENCOUNTER — Ambulatory Visit (INDEPENDENT_AMBULATORY_CARE_PROVIDER_SITE_OTHER): Payer: Medicare Other | Admitting: *Deleted

## 2010-08-14 DIAGNOSIS — Z8679 Personal history of other diseases of the circulatory system: Secondary | ICD-10-CM

## 2010-08-14 DIAGNOSIS — I4891 Unspecified atrial fibrillation: Secondary | ICD-10-CM

## 2010-08-14 DIAGNOSIS — Z7901 Long term (current) use of anticoagulants: Secondary | ICD-10-CM

## 2010-08-14 LAB — POCT INR: INR: 4.2

## 2010-08-24 ENCOUNTER — Ambulatory Visit (INDEPENDENT_AMBULATORY_CARE_PROVIDER_SITE_OTHER): Payer: Medicare Other | Admitting: *Deleted

## 2010-08-24 DIAGNOSIS — Z8679 Personal history of other diseases of the circulatory system: Secondary | ICD-10-CM

## 2010-08-24 DIAGNOSIS — I4891 Unspecified atrial fibrillation: Secondary | ICD-10-CM

## 2010-08-24 DIAGNOSIS — Z7901 Long term (current) use of anticoagulants: Secondary | ICD-10-CM

## 2010-08-24 LAB — POCT INR: INR: 3.7

## 2010-09-14 ENCOUNTER — Ambulatory Visit (INDEPENDENT_AMBULATORY_CARE_PROVIDER_SITE_OTHER): Payer: Medicare Other | Admitting: *Deleted

## 2010-09-14 DIAGNOSIS — Z8679 Personal history of other diseases of the circulatory system: Secondary | ICD-10-CM

## 2010-09-14 DIAGNOSIS — Z7901 Long term (current) use of anticoagulants: Secondary | ICD-10-CM

## 2010-09-21 ENCOUNTER — Encounter: Payer: Self-pay | Admitting: Cardiology

## 2010-09-27 ENCOUNTER — Ambulatory Visit (INDEPENDENT_AMBULATORY_CARE_PROVIDER_SITE_OTHER): Payer: Medicare Other | Admitting: Cardiology

## 2010-09-27 ENCOUNTER — Encounter: Payer: Self-pay | Admitting: Cardiology

## 2010-09-27 VITALS — BP 145/75 | HR 59 | Ht 59.0 in | Wt 128.0 lb

## 2010-09-27 DIAGNOSIS — I4891 Unspecified atrial fibrillation: Secondary | ICD-10-CM

## 2010-09-27 DIAGNOSIS — Z7901 Long term (current) use of anticoagulants: Secondary | ICD-10-CM

## 2010-09-27 DIAGNOSIS — I10 Essential (primary) hypertension: Secondary | ICD-10-CM

## 2010-09-27 DIAGNOSIS — E785 Hyperlipidemia, unspecified: Secondary | ICD-10-CM

## 2010-09-27 DIAGNOSIS — Z8679 Personal history of other diseases of the circulatory system: Secondary | ICD-10-CM

## 2010-09-27 NOTE — Assessment & Plan Note (Signed)
Followed by the patient's primary care physician 

## 2010-09-27 NOTE — Assessment & Plan Note (Signed)
Continue medical therapy. Blood pressure well controlled

## 2010-09-27 NOTE — Assessment & Plan Note (Signed)
No recurrent palpitations. The patient remained in normal sinus rhythm. EKG was reviewed today.

## 2010-09-27 NOTE — Patient Instructions (Signed)
Your physician you to follow up in 1 year. You will receive a reminder letter in the mail one-two months in advance. If you don't receive a letter, please call our office to schedule the follow-up appointment. Your physician recommends that you continue on your current medications as directed. Please refer to the Current Medication list given to you today. 

## 2010-09-27 NOTE — Assessment & Plan Note (Signed)
No complications related to Coumadin.

## 2010-09-27 NOTE — Progress Notes (Signed)
HPI The patient is an 77 year old female with a history of paroxysmal atrial fibrillation and atrial flutter. The patient is on chronic Coumadin therapy and reports no complications. She has a history of normal LV function and nonobstructive coronary disease by cardiac catheterization 2005. She also had a followup adenosine Cardiolite study with an ejection fraction 64% in March of 2009 with no ischemia. The patient has been doing well. She reports no palpitations presyncope or syncope. She reports no orthopnea PND. She does report some leg cramps secondary to varicose veins for which he wears compression stockings.  Allergies  Allergen Reactions  . Alprazolam     REACTION: sleepy  . Atorvastatin     REACTION: myalgia  . Azithromycin     REACTION: rash  . Cephalexin     REACTION: rash  . Epinephrine     REACTION: B/P,chest pain  . Penicillins     REACTION: rash  . Propranolol Hcl     REACTION: rash  . Quinapril Hcl     REACTION: rash  . Statins     REACTION: myalgia  . Sulfamethoxazole W/Trimethoprim     Current Outpatient Prescriptions on File Prior to Visit  Medication Sig Dispense Refill  . Cholecalciferol (VITAMIN D3) 400 UNITS tablet Take 400 Units by mouth daily.        . diazepam (VALIUM) 5 MG tablet Take 5 mg by mouth daily.        Marland Kitchen diltiazem (CARDIZEM CD) 180 MG 24 hr capsule Take 1 capsule (180 mg total) by mouth daily.  30 capsule  3  . glipiZIDE (GLUCOTROL XL) 5 MG 24 hr tablet Take 5 mg by mouth daily.        Marland Kitchen triamterene-hydrochlorothiazide (MAXZIDE-25) 37.5-25 MG per tablet Take 1 tablet by mouth daily.        Marland Kitchen warfarin (COUMADIN) 2.5 MG tablet Take 1 tablet (2.5 mg total) by mouth as directed.  60 tablet  3    Past Medical History  Diagnosis Date  . PAF (paroxysmal atrial fibrillation)   . HTN (hypertension)   . DM2 (diabetes mellitus, type 2)   . MR (mitral regurgitation)     mild  . CAD (coronary artery disease)     nonobstructive  . Dyslipidemia     . Drug intolerance     intolerant to numerous statins and Zetia  . Breast cancer 1988    s/p right lumpectomy  . Colon cancer 1991    s/p right hemicolectom  . Anxiety disorder     generalized    Past Surgical History  Procedure Date  . Chronic coumadin     CHAD2:3 (medical hx)  . Colon surgery 1991    rt  . Breast lumpectomy 1988    rt  . Laproscopic cholecystectomy   . Tonsillectomy   . Cataract extraction w/ intraocular lens  implant, bilateral     no cataract extraction     Family History  Problem Relation Age of Onset  . Coronary artery disease      family hx    History   Social History  . Marital Status: Married    Spouse Name: N/A    Number of Children: N/A  . Years of Education: N/A   Occupational History  . Not on file.   Social History Main Topics  . Smoking status: Never Smoker   . Smokeless tobacco: Never Used   Comment: tobacco use - no  . Alcohol Use: No  . Drug Use:  Not on file  . Sexually Active: Not on file   Other Topics Concern  . Not on file   Social History Narrative  . No narrative on file   [id number]:[id number] positives as outlined above. The remainder of the 18  point review of systems is negative  PHYSICAL EXAM BP 145/75  Pulse 59  Ht 4\' 11"  (1.499 m)  Wt 128 lb (58.06 kg)  BMI 25.85 kg/m2  General: Well-developed, well-nourished in no distress Head: Normocephalic and atraumatic Eyes:PERRLA/EOMI intact, conjunctiva and lids normal Ears: No deformity or lesions Mouth:normal dentition, normal posterior pharynx Neck: Supple, no JVD.  No masses, thyromegaly or abnormal cervical nodes Lungs: Normal breath sounds bilaterally without wheezing.  Normal percussion Cardiac: regular rate and rhythm with normal S1 and S2, no S3 or S4.  PMI is normal.  No pathological murmurs Abdomen: Normal bowel sounds, abdomen is soft and nontender without masses, organomegaly or hernias noted.  No hepatosplenomegaly MSK: Back normal, normal gait  muscle strength and tone normal Vascular: Pulse is normal in all 4 extremities Extremities: No peripheral pitting edema Neurologic: Alert and oriented x 3 Skin: Intact without lesions or rashes Lymphatics: No significant adenopathy Psychologic: Normal affect  ECG: Normal sinus rhythm no acute changes  ASSESSMENT AND PLAN

## 2010-10-05 ENCOUNTER — Encounter: Payer: Medicare Other | Admitting: *Deleted

## 2010-10-09 ENCOUNTER — Ambulatory Visit (INDEPENDENT_AMBULATORY_CARE_PROVIDER_SITE_OTHER): Payer: Medicare Other | Admitting: *Deleted

## 2010-10-09 DIAGNOSIS — Z8679 Personal history of other diseases of the circulatory system: Secondary | ICD-10-CM

## 2010-10-09 DIAGNOSIS — Z7901 Long term (current) use of anticoagulants: Secondary | ICD-10-CM

## 2010-10-09 LAB — POCT INR: INR: 2.5

## 2010-10-26 ENCOUNTER — Other Ambulatory Visit: Payer: Self-pay | Admitting: Cardiology

## 2010-11-06 ENCOUNTER — Ambulatory Visit (INDEPENDENT_AMBULATORY_CARE_PROVIDER_SITE_OTHER): Payer: Medicare Other | Admitting: *Deleted

## 2010-11-06 DIAGNOSIS — Z8679 Personal history of other diseases of the circulatory system: Secondary | ICD-10-CM

## 2010-11-06 DIAGNOSIS — Z7901 Long term (current) use of anticoagulants: Secondary | ICD-10-CM

## 2010-11-06 LAB — POCT INR: INR: 2.2

## 2010-12-04 ENCOUNTER — Ambulatory Visit (INDEPENDENT_AMBULATORY_CARE_PROVIDER_SITE_OTHER): Payer: Medicare Other | Admitting: *Deleted

## 2010-12-04 DIAGNOSIS — Z7901 Long term (current) use of anticoagulants: Secondary | ICD-10-CM

## 2010-12-04 DIAGNOSIS — Z8679 Personal history of other diseases of the circulatory system: Secondary | ICD-10-CM

## 2011-01-01 ENCOUNTER — Ambulatory Visit (INDEPENDENT_AMBULATORY_CARE_PROVIDER_SITE_OTHER): Payer: Medicare Other | Admitting: *Deleted

## 2011-01-01 DIAGNOSIS — Z7901 Long term (current) use of anticoagulants: Secondary | ICD-10-CM

## 2011-01-01 DIAGNOSIS — Z8679 Personal history of other diseases of the circulatory system: Secondary | ICD-10-CM

## 2011-01-29 ENCOUNTER — Ambulatory Visit (INDEPENDENT_AMBULATORY_CARE_PROVIDER_SITE_OTHER): Payer: Medicare Other | Admitting: *Deleted

## 2011-01-29 DIAGNOSIS — Z8679 Personal history of other diseases of the circulatory system: Secondary | ICD-10-CM

## 2011-01-29 DIAGNOSIS — Z7901 Long term (current) use of anticoagulants: Secondary | ICD-10-CM

## 2011-02-13 ENCOUNTER — Other Ambulatory Visit: Payer: Self-pay | Admitting: Cardiology

## 2011-03-12 ENCOUNTER — Ambulatory Visit (INDEPENDENT_AMBULATORY_CARE_PROVIDER_SITE_OTHER): Payer: Medicare Other | Admitting: *Deleted

## 2011-03-12 DIAGNOSIS — Z8679 Personal history of other diseases of the circulatory system: Secondary | ICD-10-CM | POA: Diagnosis not present

## 2011-03-12 DIAGNOSIS — Z7901 Long term (current) use of anticoagulants: Secondary | ICD-10-CM | POA: Diagnosis not present

## 2011-03-12 LAB — POCT INR: INR: 1.6

## 2011-03-19 DIAGNOSIS — M4000 Postural kyphosis, site unspecified: Secondary | ICD-10-CM | POA: Diagnosis not present

## 2011-03-19 DIAGNOSIS — M48061 Spinal stenosis, lumbar region without neurogenic claudication: Secondary | ICD-10-CM | POA: Diagnosis not present

## 2011-03-25 ENCOUNTER — Telehealth: Payer: Self-pay | Admitting: *Deleted

## 2011-03-25 NOTE — Telephone Encounter (Signed)
Patient left message on voice mail that she had cold & had questions about what to do.  Bought Corcidin.   Returned call to patient - stated she has had cold since last Tuesday.  Very congested, productive cough - white to yellow.  No fever.  Stated she did call Dr. Dimas Aguas & his nurse recommended Claritin OTC.  Stated she had nose bleed last week so she did not take her coumadin on Saturday or Sunday.   Advised her to take the Claritin as advised by PMD.  Do not take Corcidid and Claritin.  Can use saline for nasal congestion.  If no better in the next few days, call PMD back to be seen in office.  Also, informed her that her nose bleed was probably not related to her coumadin.  Probably due to dry nose or blowing too hard and capillary breaking.  Patient does have PT check with Misty Stanley scheduled for Friday, 2/1.  She verbalized understanding.

## 2011-03-27 DIAGNOSIS — R040 Epistaxis: Secondary | ICD-10-CM | POA: Diagnosis not present

## 2011-03-27 DIAGNOSIS — J019 Acute sinusitis, unspecified: Secondary | ICD-10-CM | POA: Diagnosis not present

## 2011-03-29 ENCOUNTER — Ambulatory Visit (INDEPENDENT_AMBULATORY_CARE_PROVIDER_SITE_OTHER): Payer: Medicare Other | Admitting: *Deleted

## 2011-03-29 DIAGNOSIS — Z8679 Personal history of other diseases of the circulatory system: Secondary | ICD-10-CM

## 2011-03-29 DIAGNOSIS — Z7901 Long term (current) use of anticoagulants: Secondary | ICD-10-CM

## 2011-03-29 LAB — POCT INR: INR: 1.3

## 2011-03-29 NOTE — Patient Instructions (Signed)
Consistency with leafy green vegetable intake. 

## 2011-04-05 ENCOUNTER — Encounter: Payer: Medicare Other | Admitting: *Deleted

## 2011-04-05 ENCOUNTER — Ambulatory Visit (INDEPENDENT_AMBULATORY_CARE_PROVIDER_SITE_OTHER): Payer: Medicare Other | Admitting: *Deleted

## 2011-04-05 DIAGNOSIS — Z7901 Long term (current) use of anticoagulants: Secondary | ICD-10-CM | POA: Diagnosis not present

## 2011-04-05 DIAGNOSIS — Z8679 Personal history of other diseases of the circulatory system: Secondary | ICD-10-CM

## 2011-04-30 ENCOUNTER — Ambulatory Visit (INDEPENDENT_AMBULATORY_CARE_PROVIDER_SITE_OTHER): Payer: Medicare Other | Admitting: *Deleted

## 2011-04-30 DIAGNOSIS — Z8679 Personal history of other diseases of the circulatory system: Secondary | ICD-10-CM

## 2011-04-30 DIAGNOSIS — Z7901 Long term (current) use of anticoagulants: Secondary | ICD-10-CM

## 2011-04-30 LAB — POCT INR: INR: 2.8

## 2011-05-13 DIAGNOSIS — M129 Arthropathy, unspecified: Secondary | ICD-10-CM | POA: Diagnosis not present

## 2011-05-13 DIAGNOSIS — G622 Polyneuropathy due to other toxic agents: Secondary | ICD-10-CM | POA: Diagnosis not present

## 2011-05-13 DIAGNOSIS — G619 Inflammatory polyneuropathy, unspecified: Secondary | ICD-10-CM | POA: Diagnosis not present

## 2011-05-22 ENCOUNTER — Other Ambulatory Visit: Payer: Self-pay | Admitting: Cardiology

## 2011-05-28 ENCOUNTER — Ambulatory Visit (INDEPENDENT_AMBULATORY_CARE_PROVIDER_SITE_OTHER): Payer: Medicare Other | Admitting: *Deleted

## 2011-05-28 DIAGNOSIS — Z8679 Personal history of other diseases of the circulatory system: Secondary | ICD-10-CM

## 2011-05-28 DIAGNOSIS — Z7901 Long term (current) use of anticoagulants: Secondary | ICD-10-CM | POA: Diagnosis not present

## 2011-05-28 LAB — POCT INR: INR: 2.4

## 2011-06-12 DIAGNOSIS — L821 Other seborrheic keratosis: Secondary | ICD-10-CM | POA: Diagnosis not present

## 2011-06-12 DIAGNOSIS — D239 Other benign neoplasm of skin, unspecified: Secondary | ICD-10-CM | POA: Diagnosis not present

## 2011-06-20 DIAGNOSIS — H01029 Squamous blepharitis unspecified eye, unspecified eyelid: Secondary | ICD-10-CM | POA: Diagnosis not present

## 2011-06-24 DIAGNOSIS — M129 Arthropathy, unspecified: Secondary | ICD-10-CM | POA: Diagnosis not present

## 2011-06-26 DIAGNOSIS — Z1231 Encounter for screening mammogram for malignant neoplasm of breast: Secondary | ICD-10-CM | POA: Diagnosis not present

## 2011-07-01 DIAGNOSIS — IMO0001 Reserved for inherently not codable concepts without codable children: Secondary | ICD-10-CM | POA: Diagnosis not present

## 2011-07-01 DIAGNOSIS — E7800 Pure hypercholesterolemia, unspecified: Secondary | ICD-10-CM | POA: Diagnosis not present

## 2011-07-01 DIAGNOSIS — E785 Hyperlipidemia, unspecified: Secondary | ICD-10-CM | POA: Diagnosis not present

## 2011-07-01 DIAGNOSIS — M5430 Sciatica, unspecified side: Secondary | ICD-10-CM | POA: Diagnosis not present

## 2011-07-02 ENCOUNTER — Ambulatory Visit (INDEPENDENT_AMBULATORY_CARE_PROVIDER_SITE_OTHER): Payer: Medicare Other | Admitting: *Deleted

## 2011-07-02 DIAGNOSIS — Z8679 Personal history of other diseases of the circulatory system: Secondary | ICD-10-CM | POA: Diagnosis not present

## 2011-07-02 DIAGNOSIS — Z7901 Long term (current) use of anticoagulants: Secondary | ICD-10-CM

## 2011-07-05 DIAGNOSIS — I4891 Unspecified atrial fibrillation: Secondary | ICD-10-CM | POA: Diagnosis not present

## 2011-07-05 DIAGNOSIS — M129 Arthropathy, unspecified: Secondary | ICD-10-CM | POA: Diagnosis not present

## 2011-07-05 DIAGNOSIS — G622 Polyneuropathy due to other toxic agents: Secondary | ICD-10-CM | POA: Diagnosis not present

## 2011-07-05 DIAGNOSIS — Z0000 Encounter for general adult medical examination without abnormal findings: Secondary | ICD-10-CM | POA: Diagnosis not present

## 2011-07-05 DIAGNOSIS — E7800 Pure hypercholesterolemia, unspecified: Secondary | ICD-10-CM | POA: Diagnosis not present

## 2011-07-05 DIAGNOSIS — Z23 Encounter for immunization: Secondary | ICD-10-CM | POA: Diagnosis not present

## 2011-07-05 DIAGNOSIS — G619 Inflammatory polyneuropathy, unspecified: Secondary | ICD-10-CM | POA: Diagnosis not present

## 2011-07-05 DIAGNOSIS — E119 Type 2 diabetes mellitus without complications: Secondary | ICD-10-CM | POA: Diagnosis not present

## 2011-08-13 ENCOUNTER — Ambulatory Visit (INDEPENDENT_AMBULATORY_CARE_PROVIDER_SITE_OTHER): Payer: Medicare Other | Admitting: *Deleted

## 2011-08-13 DIAGNOSIS — Z7901 Long term (current) use of anticoagulants: Secondary | ICD-10-CM | POA: Diagnosis not present

## 2011-08-13 DIAGNOSIS — Z8679 Personal history of other diseases of the circulatory system: Secondary | ICD-10-CM | POA: Diagnosis not present

## 2011-08-13 MED ORDER — WARFARIN SODIUM 2.5 MG PO TABS: 2.5000 mg | ORAL_TABLET | ORAL | Status: DC

## 2011-08-15 ENCOUNTER — Other Ambulatory Visit: Payer: Self-pay | Admitting: Cardiology

## 2011-09-24 ENCOUNTER — Ambulatory Visit (INDEPENDENT_AMBULATORY_CARE_PROVIDER_SITE_OTHER): Payer: Medicare Other | Admitting: *Deleted

## 2011-09-24 DIAGNOSIS — Z8679 Personal history of other diseases of the circulatory system: Secondary | ICD-10-CM

## 2011-09-24 DIAGNOSIS — Z7901 Long term (current) use of anticoagulants: Secondary | ICD-10-CM

## 2011-09-24 MED ORDER — WARFARIN SODIUM 2.5 MG PO TABS: 2.5000 mg | ORAL_TABLET | ORAL | Status: DC

## 2011-09-27 ENCOUNTER — Ambulatory Visit (INDEPENDENT_AMBULATORY_CARE_PROVIDER_SITE_OTHER): Payer: Medicare Other | Admitting: Cardiology

## 2011-09-27 ENCOUNTER — Encounter: Payer: Self-pay | Admitting: Cardiology

## 2011-09-27 VITALS — BP 152/79 | HR 66 | Ht 59.0 in | Wt 126.0 lb

## 2011-09-27 DIAGNOSIS — I10 Essential (primary) hypertension: Secondary | ICD-10-CM | POA: Diagnosis not present

## 2011-09-27 DIAGNOSIS — I4891 Unspecified atrial fibrillation: Secondary | ICD-10-CM

## 2011-09-27 DIAGNOSIS — Z79899 Other long term (current) drug therapy: Secondary | ICD-10-CM | POA: Diagnosis not present

## 2011-09-27 DIAGNOSIS — Z8679 Personal history of other diseases of the circulatory system: Secondary | ICD-10-CM | POA: Diagnosis not present

## 2011-09-27 MED ORDER — WARFARIN SODIUM 2.5 MG PO TABS: 2.5000 mg | ORAL_TABLET | ORAL | Status: DC

## 2011-09-27 NOTE — Patient Instructions (Signed)
   Lab - today - BMET   Office will contact with results Your physician wants you to follow up in:  1 year.  You will receive a reminder letter in the mail one-two months in advance.  If you don't receive a letter, please call our office to schedule the follow up appointment

## 2011-09-29 NOTE — Assessment & Plan Note (Signed)
BP has been running high.  She has not had recent labs.  I will check a BMET today.  If renal function is ok, I will start her on lisinopril 5 mg daily given HTN in the setting of type II diabetes.

## 2011-09-29 NOTE — Assessment & Plan Note (Signed)
Stable, NSR today. No recent symptoms consistent with atrial fibrillation. Continue coumadin and diltiazem CD.

## 2011-09-29 NOTE — Progress Notes (Signed)
Patient ID: Judith Chung, female   DOB: Jun 28, 1924, 78 y.o.   MRN: [id number] PCP: Dr. Dimas Aguas  The patient is an 78 year old female with a history of paroxysmal atrial fibrillation and atrial flutter. The patient is on chronic Coumadin therapy and reports no bleeding complications. She has a history of normal LV function and nonobstructive coronary disease by cardiac catheterization 2005. She also had a followup adenosine Cardiolite study with an ejection fraction 64% in March of 2009 with no ischemia. The patient has been doing well. She reports no palpitations presyncope or syncope. She reports no orthopnea or PND.  Systolic BP has been running in the 150s when she checks it at home and is 152/79 today.   ECG: NSR, borderline LVH  Allergies  Allergen Reactions  . Alprazolam     REACTION: sleepy  . Atorvastatin     REACTION: myalgia  . Azithromycin     REACTION: rash  . Cephalexin     REACTION: rash  . Epinephrine     REACTION: B/P,chest pain  . Penicillins     REACTION: rash  . Propranolol Hcl     REACTION: rash  . Quinapril Hcl     REACTION: rash  . Statins     REACTION: myalgia  . Sulfamethoxazole W-Trimethoprim     Current Outpatient Prescriptions on File Prior to Visit  Medication Sig Dispense Refill  . Cholecalciferol (VITAMIN D3) 400 UNITS tablet Take 400 Units by mouth daily.        . diazepam (VALIUM) 5 MG tablet Take 5 mg by mouth daily.        Marland Kitchen diltiazem (CARDIZEM CD) 180 MG 24 hr capsule TAKE ONE CAPSULE BY MOUTH EVERY DAY  30 capsule  6  . gemfibrozil (LOPID) 600 MG tablet Take 1 tablet by mouth Twice daily.      Marland Kitchen glipiZIDE (GLUCOTROL XL) 5 MG 24 hr tablet Take 5 mg by mouth daily.        Marland Kitchen triamterene-hydrochlorothiazide (MAXZIDE-25) 37.5-25 MG per tablet Take 1 tablet by mouth daily.        Marland Kitchen warfarin (COUMADIN) 2.5 MG tablet Take 1 tablet (2.5 mg total) by mouth as directed. Take as directed by coumadin clinic  45 tablet  3    Past Medical History    Diagnosis Date  . PAF (paroxysmal atrial fibrillation)   . HTN (hypertension)   . DM2 (diabetes mellitus, type 2)   . MR (mitral regurgitation)     mild  . CAD (coronary artery disease)     nonobstructive  . Dyslipidemia   . Drug intolerance     intolerant to numerous statins and Zetia  . Breast cancer 1988    s/p right lumpectomy  . Colon cancer 1991    s/p right hemicolectom  . Anxiety disorder     generalized    Past Surgical History  Procedure Date  . Chronic coumadin     CHAD2:3 (medical hx)  . Colon surgery 1991    rt  . Breast lumpectomy 1988    rt  . Laproscopic cholecystectomy   . Tonsillectomy   . Cataract extraction w/ intraocular lens  implant, bilateral     no cataract extraction     Family History  Problem Relation Age of Onset  . Coronary artery disease      family hx    History   Social History  . Marital Status: Married    Spouse Name: N/A    Number of  Children: N/A  . Years of Education: N/A   Occupational History  . Not on file.   Social History Main Topics  . Smoking status: Never Smoker   . Smokeless tobacco: Never Used   Comment: tobacco use - no  . Alcohol Use: No  . Drug Use: Not on file  . Sexually Active: Not on file   Other Topics Concern  . Not on file   Social History Narrative  . No narrative on file   [id number] positives as outlined above. The remainder of the 18  point review of systems is negative  PHYSICAL EXAM BP 152/79  Pulse 66  Ht 4\' 11"  (1.499 m)  Wt 126 lb (57.153 kg)  BMI 25.45 kg/m2  General: Well-developed, well-nourished in no distress Neck: Supple, no JVD.  No masses, thyromegaly or abnormal cervical nodes Lungs: Normal breath sounds bilaterally without wheezing.   Cardiac: regular rate and rhythm with normal S1 and S2, no S3 or S4.  PMI is normal.  No pathological murmurs Abdomen: Normal bowel sounds, abdomen is soft and nontender without masses, organomegaly or hernias noted.  No  hepatosplenomegaly Vascular: Pulse is normal in all 4 extremities Extremities: No peripheral pitting edema Neurologic: Alert and oriented x 3 Psychologic: Normal affect

## 2011-11-05 ENCOUNTER — Ambulatory Visit (INDEPENDENT_AMBULATORY_CARE_PROVIDER_SITE_OTHER): Payer: Medicare Other | Admitting: *Deleted

## 2011-11-05 DIAGNOSIS — Z7901 Long term (current) use of anticoagulants: Secondary | ICD-10-CM | POA: Diagnosis not present

## 2011-11-05 DIAGNOSIS — Z8679 Personal history of other diseases of the circulatory system: Secondary | ICD-10-CM

## 2011-11-15 DIAGNOSIS — Z23 Encounter for immunization: Secondary | ICD-10-CM | POA: Diagnosis not present

## 2011-11-28 DIAGNOSIS — R509 Fever, unspecified: Secondary | ICD-10-CM | POA: Diagnosis not present

## 2011-11-28 DIAGNOSIS — N3090 Cystitis, unspecified without hematuria: Secondary | ICD-10-CM | POA: Diagnosis not present

## 2011-11-28 DIAGNOSIS — M129 Arthropathy, unspecified: Secondary | ICD-10-CM | POA: Diagnosis not present

## 2011-11-28 DIAGNOSIS — M2550 Pain in unspecified joint: Secondary | ICD-10-CM | POA: Diagnosis not present

## 2011-11-29 ENCOUNTER — Encounter (HOSPITAL_COMMUNITY): Payer: Self-pay | Admitting: *Deleted

## 2011-11-29 ENCOUNTER — Emergency Department (HOSPITAL_COMMUNITY): Payer: Medicare Other

## 2011-11-29 ENCOUNTER — Emergency Department (HOSPITAL_COMMUNITY)
Admission: EM | Admit: 2011-11-29 | Discharge: 2011-11-29 | Disposition: A | Discharge status: Home / Self Care | Payer: Medicare Other | Attending: Emergency Medicine | Admitting: Emergency Medicine

## 2011-11-29 DIAGNOSIS — F411 Generalized anxiety disorder: Secondary | ICD-10-CM | POA: Diagnosis not present

## 2011-11-29 DIAGNOSIS — S0180XA Unspecified open wound of other part of head, initial encounter: Secondary | ICD-10-CM | POA: Diagnosis not present

## 2011-11-29 DIAGNOSIS — S0083XA Contusion of other part of head, initial encounter: Secondary | ICD-10-CM | POA: Diagnosis not present

## 2011-11-29 DIAGNOSIS — W19XXXA Unspecified fall, initial encounter: Secondary | ICD-10-CM | POA: Insufficient documentation

## 2011-11-29 DIAGNOSIS — Z79899 Other long term (current) drug therapy: Secondary | ICD-10-CM | POA: Diagnosis not present

## 2011-11-29 DIAGNOSIS — E119 Type 2 diabetes mellitus without complications: Secondary | ICD-10-CM | POA: Insufficient documentation

## 2011-11-29 DIAGNOSIS — I2510 Atherosclerotic heart disease of native coronary artery without angina pectoris: Secondary | ICD-10-CM | POA: Diagnosis not present

## 2011-11-29 DIAGNOSIS — Z853 Personal history of malignant neoplasm of breast: Secondary | ICD-10-CM | POA: Insufficient documentation

## 2011-11-29 DIAGNOSIS — R5381 Other malaise: Secondary | ICD-10-CM | POA: Diagnosis not present

## 2011-11-29 DIAGNOSIS — S0181XA Laceration without foreign body of other part of head, initial encounter: Secondary | ICD-10-CM

## 2011-11-29 DIAGNOSIS — Z85038 Personal history of other malignant neoplasm of large intestine: Secondary | ICD-10-CM | POA: Insufficient documentation

## 2011-11-29 DIAGNOSIS — I4891 Unspecified atrial fibrillation: Secondary | ICD-10-CM | POA: Insufficient documentation

## 2011-11-29 DIAGNOSIS — I10 Essential (primary) hypertension: Secondary | ICD-10-CM | POA: Insufficient documentation

## 2011-11-29 DIAGNOSIS — E871 Hypo-osmolality and hyponatremia: Secondary | ICD-10-CM

## 2011-11-29 DIAGNOSIS — E876 Hypokalemia: Secondary | ICD-10-CM | POA: Diagnosis not present

## 2011-11-29 DIAGNOSIS — E785 Hyperlipidemia, unspecified: Secondary | ICD-10-CM | POA: Insufficient documentation

## 2011-11-29 DIAGNOSIS — S1093XA Contusion of unspecified part of neck, initial encounter: Secondary | ICD-10-CM | POA: Diagnosis not present

## 2011-11-29 HISTORY — DX: Unspecified osteoarthritis, unspecified site: M19.90

## 2011-11-29 LAB — COMPREHENSIVE METABOLIC PANEL
ALT: 12 U/L (ref 0–35)
AST: 17 U/L (ref 0–37)
Albumin: 3.6 g/dL (ref 3.5–5.2)
Alkaline Phosphatase: 38 U/L — ABNORMAL LOW (ref 39–117)
BUN: 24 mg/dL — ABNORMAL HIGH (ref 6–23)
CO2: 26 mEq/L (ref 19–32)
Calcium: 10 mg/dL (ref 8.4–10.5)
Chloride: 88 mEq/L — ABNORMAL LOW (ref 96–112)
Creatinine, Ser: 0.96 mg/dL (ref 0.50–1.10)
GFR calc Af Amer: 61 mL/min — ABNORMAL LOW (ref >90)
GFR calc non Af Amer: 52 mL/min — ABNORMAL LOW (ref >90)
Glucose, Bld: 189 mg/dL — ABNORMAL HIGH (ref 70–99)
Potassium: 3 mEq/L — ABNORMAL LOW (ref 3.5–5.1)
Sodium: 128 mEq/L — ABNORMAL LOW (ref 135–145)
Total Bilirubin: 0.8 mg/dL (ref 0.3–1.2)
Total Protein: 7.1 g/dL (ref 6.0–8.3)

## 2011-11-29 LAB — TYPE AND SCREEN

## 2011-11-29 LAB — URINALYSIS, ROUTINE W REFLEX MICROSCOPIC
Bilirubin Urine: NEGATIVE
Glucose, UA: NEGATIVE mg/dL
Hgb urine dipstick: NEGATIVE
Ketones, ur: NEGATIVE mg/dL
Leukocytes, UA: NEGATIVE
Nitrite: NEGATIVE
Protein, ur: NEGATIVE mg/dL
Specific Gravity, Urine: 1.01 (ref 1.005–1.030)
Urobilinogen, UA: 0.2 mg/dL (ref 0.0–1.0)
pH: 6.5 (ref 5.0–8.0)

## 2011-11-29 LAB — PROTIME-INR
INR: 1.38 (ref 0.00–1.49)
Prothrombin Time: 16.6 seconds — ABNORMAL HIGH (ref 11.6–15.2)

## 2011-11-29 LAB — CBC
HCT: 34.8 % — ABNORMAL LOW (ref 36.0–46.0)
Hemoglobin: 12.3 g/dL (ref 12.0–15.0)
MCH: 31 pg (ref 26.0–34.0)
MCHC: 35.3 g/dL (ref 30.0–36.0)
MCV: 87.7 fL (ref 78.0–100.0)
Platelets: 280 10*3/uL (ref 150–400)
RBC: 3.97 MIL/uL (ref 3.87–5.11)
RDW: 12.8 % (ref 11.5–15.5)
WBC: 6.9 10*3/uL (ref 4.0–10.5)

## 2011-11-29 LAB — TROPONIN I: Troponin I: <0.3 ng/mL (ref <0.30)

## 2011-11-29 LAB — MAGNESIUM: Magnesium: 1.6 mg/dL (ref 1.5–2.5)

## 2011-11-29 MED ORDER — TETANUS-DIPHTH-ACELL PERTUSSIS 5-2.5-18.5 LF-MCG/0.5 IM SUSP
0.5000 mL | Freq: Once | INTRAMUSCULAR | Status: AC
  Administered 2011-11-29: 0.5 mL via INTRAMUSCULAR
  Filled 2011-11-29: qty 0.5

## 2011-11-29 MED ORDER — POTASSIUM CHLORIDE CRYS ER 20 MEQ PO TBCR: EXTENDED_RELEASE_TABLET | ORAL | Status: DC

## 2011-11-29 MED ORDER — SODIUM CHLORIDE 0.9 % IV BOLUS (SEPSIS)
500.0000 mL | Freq: Once | INTRAVENOUS | Status: AC
  Administered 2011-11-29: 500 mL via INTRAVENOUS

## 2011-11-29 MED ORDER — POTASSIUM CHLORIDE CRYS ER 20 MEQ PO TBCR
40.0000 meq | EXTENDED_RELEASE_TABLET | Freq: Once | ORAL | Status: AC
  Administered 2011-11-29: 40 meq via ORAL
  Filled 2011-11-29: qty 1

## 2011-11-29 MED ORDER — SODIUM CHLORIDE 0.9 % IV SOLN
INTRAVENOUS | Status: DC
  Administered 2011-11-29: 10 mL/h via INTRAVENOUS

## 2011-11-29 MED ORDER — POTASSIUM CHLORIDE CRYS ER 20 MEQ PO TBCR
EXTENDED_RELEASE_TABLET | ORAL | Status: AC
  Filled 2011-11-29: qty 1

## 2011-11-29 NOTE — ED Provider Notes (Signed)
History     CSN: [id number]  Arrival date & time 11/29/11  1316   First MD Initiated Contact with Patient 11/29/11 1351      Chief Complaint  Patient presents with  . Fall    (Consider location/radiation/quality/duration/timing/severity/associated sxs/prior treatment) Patient is a 78 y.o. female presenting with fall. The history is provided by the patient and the spouse.  Fall Pertinent negatives include no fever, no numbness and no abdominal pain.  pt got up to go to bathroom at 3 am today, used bathroom, then fell, pt states unsure what caused fall, but states does not feel lost consciousness, got self up and went back to bed. Went to [facility] this afternoon, but ct down, so they sent here. Pt states generally had not felt well x 2 days, general malaise/weakness. Denies focal numbness/weakness. Denies any current or recent cp or discomfort. w fall today, hit head, lac to forehead, contusion/pain to area. Denies headache prior to fall. Tetanus unknown. Denies neck pain, back pain or other injury from fall. Has been eating and drinking normally. No nvd. No gu c/o. Denies recent change in meds or new meds. Is on coumadin, hx afib, no abn bleeding or bruising.      Past Medical History  Diagnosis Date  . PAF (paroxysmal atrial fibrillation)   . HTN (hypertension)   . DM2 (diabetes mellitus, type 2)   . MR (mitral regurgitation)     mild  . CAD (coronary artery disease)     nonobstructive  . Dyslipidemia   . Drug intolerance     intolerant to numerous statins and Zetia  . Anxiety disorder     generalized  . Arthritis   . Breast cancer 1988    s/p right lumpectomy  . Colon cancer 1991    s/p right hemicolectom    Past Surgical History  Procedure Date  . Chronic coumadin     CHAD2:3 (medical hx)  . Colon surgery 1991    rt  . Laproscopic cholecystectomy   . Tonsillectomy   . Cataract extraction w/ intraocular lens  implant, bilateral     no cataract extraction    . Breast lumpectomy 1988    rt  . Appendectomy   . Tonsillectomy   . Cholecystectomy     Family History  Problem Relation Age of Onset  . Coronary artery disease      family hx    History  Substance Use Topics  . Smoking status: Never Smoker   . Smokeless tobacco: Never Used   Comment: tobacco use - no  . Alcohol Use: No    OB History    Grav Para Term Preterm Abortions TAB SAB Ect Mult Living                  Review of Systems  Constitutional: Negative for fever and chills.  HENT: Negative for neck pain and neck stiffness.   Eyes: Negative for pain, redness and visual disturbance.  Respiratory: Negative for cough and shortness of breath.   Cardiovascular: Negative for chest pain, palpitations and leg swelling.  Gastrointestinal: Negative for abdominal pain.  Genitourinary: Negative for dysuria and flank pain.  Musculoskeletal: Negative for back pain.  Skin: Negative for rash.  Neurological: Negative for numbness.  Hematological: Does not bruise/bleed easily.  Psychiatric/Behavioral: Negative for confusion.    Allergies  Alprazolam; Atorvastatin; Azithromycin; Cephalexin; Crestor; Epinephrine; Inderal; Penicillins; Polysporin; Propranolol hcl; Quinapril hcl; Statins; Sulfamethoxazole w-trimethoprim; and Zetia  Home Medications  Current Outpatient Rx  Name Route Sig Dispense Refill  . VITAMIN D3 400 UNITS PO TABS Oral Take 400 Units by mouth daily.      Marland Kitchen DIAZEPAM 5 MG PO TABS Oral Take 5 mg by mouth daily.      Marland Kitchen DILTIAZEM HCL ER COATED BEADS 180 MG PO CP24  TAKE ONE CAPSULE BY MOUTH EVERY DAY 30 capsule 6  . GEMFIBROZIL 600 MG PO TABS Oral Take 1 tablet by mouth Twice daily.    Marland Kitchen GLIPIZIDE ER 5 MG PO TB24 Oral Take 5 mg by mouth daily.      . TRIAMTERENE-HCTZ 37.5-25 MG PO TABS Oral Take 1 tablet by mouth daily.      . WARFARIN SODIUM 2.5 MG PO TABS Oral Take 1 tablet (2.5 mg total) by mouth as directed. Take as directed by coumadin clinic 45 tablet 3     BP 143/78  Pulse 64  Temp 98.2 F (36.8 C) (Oral)  Resp 20  Ht 5' (1.524 m)  Wt 125 lb (56.7 kg)  BMI 24.41 kg/m2  SpO2 98%  Physical Exam  Nursing note and vitals reviewed. Constitutional: She is oriented to person, place, and time. She appears well-developed and well-nourished. No distress.  HENT:  Head: Atraumatic.  Nose: Nose normal.  Mouth/Throat: Oropharynx is clear and moist.       No sinus or temporal tenderness.  Eyes: Conjunctivae normal and EOM are normal. Pupils are equal, round, and reactive to light. No scleral icterus.  Neck: Normal range of motion. Neck supple. No tracheal deviation present. No thyromegaly present.       No stiffness or rigidity.   Cardiovascular: Normal rate, regular rhythm, normal heart sounds and intact distal pulses.  Exam reveals no gallop and no friction rub.   No murmur heard. Pulmonary/Chest: Effort normal and breath sounds normal. No respiratory distress. She exhibits no tenderness.  Abdominal: Soft. Normal appearance and bowel sounds are normal. She exhibits no distension and no mass. There is no tenderness. There is no rebound and no guarding.  Genitourinary:       No cva tenderness.  Musculoskeletal: Normal range of motion. She exhibits no edema and no tenderness.  Neurological: She is alert and oriented to person, place, and time. No cranial nerve deficit.       Motor intact bilaterally. Steady gait.   Skin: Skin is warm and dry. No rash noted. She is not diaphoretic.  Psychiatric: She has a normal mood and affect.    ED Course  Procedures (including critical care time)   Labs Reviewed  CBC  COMPREHENSIVE METABOLIC PANEL  URINALYSIS, ROUTINE W REFLEX MICROSCOPIC  PROTIME-INR  TYPE AND SCREEN  TROPONIN I   Results for orders placed during the hospital encounter of 11/29/11  CBC      Component Value Range   WBC 6.9  4.0 - 10.5 K/uL   RBC 3.97  3.87 - 5.11 MIL/uL   Hemoglobin 12.3  12.0 - 15.0 g/dL   HCT 16.1 (*)  09.6 - 46.0 %   MCV 87.7  78.0 - 100.0 fL   MCH 31.0  26.0 - 34.0 pg   MCHC 35.3  30.0 - 36.0 g/dL   RDW 04.5  40.9 - 81.1 %   Platelets 280  150 - 400 K/uL  COMPREHENSIVE METABOLIC PANEL      Component Value Range   Sodium 128 (*) 135 - 145 mEq/L   Potassium 3.0 (*) 3.5 - 5.1 mEq/L  Chloride 88 (*) 96 - 112 mEq/L   CO2 26  19 - 32 mEq/L   Glucose, Bld 189 (*) 70 - 99 mg/dL   BUN 24 (*) 6 - 23 mg/dL   Creatinine, Ser 1.61  0.50 - 1.10 mg/dL   Calcium 09.6  8.4 - 04.5 mg/dL   Total Protein 7.1  6.0 - 8.3 g/dL   Albumin 3.6  3.5 - 5.2 g/dL   AST 17  0 - 37 U/L   ALT 12  0 - 35 U/L   Alkaline Phosphatase 38 (*) 39 - 117 U/L   Total Bilirubin 0.8  0.3 - 1.2 mg/dL   GFR calc non Af Amer 52 (*) >90 mL/min   GFR calc Af Amer 61 (*) >90 mL/min  URINALYSIS, ROUTINE W REFLEX MICROSCOPIC      Component Value Range   Color, Urine YELLOW  YELLOW   APPearance CLEAR  CLEAR   Specific Gravity, Urine 1.010  1.005 - 1.030   pH 6.5  5.0 - 8.0   Glucose, UA NEGATIVE  NEGATIVE mg/dL   Hgb urine dipstick NEGATIVE  NEGATIVE   Bilirubin Urine NEGATIVE  NEGATIVE   Ketones, ur NEGATIVE  NEGATIVE mg/dL   Protein, ur NEGATIVE  NEGATIVE mg/dL   Urobilinogen, UA 0.2  0.0 - 1.0 mg/dL   Nitrite NEGATIVE  NEGATIVE   Leukocytes, UA NEGATIVE  NEGATIVE  PROTIME-INR      Component Value Range   Prothrombin Time 16.6 (*) 11.6 - 15.2 seconds   INR 1.38  0.00 - 1.49  TROPONIN I      Component Value Range   Troponin I <0.30  <0.30 ng/mL   Ct Head Wo Contrast  11/29/2011  *RADIOLOGY REPORT*  Clinical Data: Pain post trauma  CT HEAD WITHOUT CONTRAST  Technique:  Contiguous axial images were obtained from the base of the skull through the vertex without contrast.  Comparison: Brain MRI January 09, 2010  Findings: There is age-related volume loss.  There is no mass, hemorrhage, extra-axial fluid collection, or midline shift.  There is rather minimal periventricular small vessel disease.  Gray-white compartments  are otherwise normal.  Bony calvarium appears intact.  Mastoid air cells are virtually aplastic bilaterally.  There is a right frontal scalp hematoma.  IMPRESSION: Right frontal scalp hematoma.  Age-related volume loss with minimal small vessel disease.  Virtually aplastic mastoids. Study otherwise unremarkable.   Original Report Authenticated By: Arvin Collard. WOODRUFF III, M.D.       MDM  Iv ns. Labs. Ct.   Reviewed nursing notes and prior charts for additional history.     Date: 11/29/2011  Rate: 61  Rhythm: normal sinus rhythm  QRS Axis: normal  Intervals: normal  ST/T Wave abnormalities: nonspecific ST/T changes  Conduction Disutrbances:none  Narrative Interpretation:   Old EKG Reviewed: unchanged   Correct na 129-130, mildly low. Is on maxzide. Iv ns in ed.  k low, kcl 40 po.   Recheck remains awake and alert in ED. Ms at baseline.  Denies loc early this am w fall, states got up and went back to bed.   Pt subther on coumadin, although given in sinus rhythm and fall/fall risk, not necessarily a negative - will have f/u pcp/card in next couple days to decide on whether to continue. Will also have f/u closely re further assess meds, recheck na and k, and further follow up.  LACERATION REPAIR Performed by: Suzi Roots Authorized by: Suzi Roots Consent: Verbal consent obtained. Risks  and benefits: risks, benefits and alternatives were discussed Consent given by: patient Patient identity confirmed: provided demographic data Prepped and Draped in normal sterile fashion Wound explored  Laceration Location: FACE  Laceration Length: 1cm  No Foreign Bodies seen or palpated  Anesthesia: local infiltration  Local anesthetic: none  Anesthetic total: n/a  Irrigation method: syringe Amount of cleaning: standard  Skin closure: dermabond  Number of sutures: n/a  Technique: dermabond/wound adhesive closure  Patient tolerance: Patient tolerated the procedure well  with no immediate complications.    Recheck remains at baseline, no new c/o. No headache. Spine nt. Pt states feels ready for d/c. Ambulatory to bathroom, no faintness or dizziness.      Suzi Roots, MD 11/29/11 [phone]

## 2011-11-29 NOTE — ED Notes (Signed)
Sent from [facility] ER  Ct scanner down there.  Alert, talking, Fell in bath tub 3 am.  No loc  Lac to rt eyebrow.Denies neck pain

## 2011-11-29 NOTE — ED Notes (Signed)
Speech is notably slurred.  Patient and husband verify that speech is different from baseline.  Husband states patient's memory has been slightly impaired recently and her balance has not been as steady as baseline.  Patient states her vision has become more blurred in last several months.

## 2011-11-29 NOTE — ED Notes (Signed)
Patient with no complaints at this time. Respirations even and unlabored. Skin warm/dry. Discharge instructions reviewed with patient at this time. Patient given opportunity to voice concerns/ask questions. Patient discharged at this time and left Emergency Department with steady gait.   

## 2011-11-30 NOTE — ED Notes (Signed)
Dr. Jeannette How office called requesting ED record.  Obtained verbal consent over the phone with pt that it was ok to release pt's record to Dr. Jeannette How office.  Witnessed by Dub Mikes RN.

## 2011-11-30 NOTE — ED Notes (Signed)
Pt's husband says that [facility] pharmacy in [place] would not take the prescription for potassium because it was not signed.  Spoke with pharmacist and called in potassium prescription.  Instructed pt that prescription was called in and he could throw the printed prescription away.

## 2011-12-09 DIAGNOSIS — E876 Hypokalemia: Secondary | ICD-10-CM | POA: Diagnosis not present

## 2011-12-10 DIAGNOSIS — M129 Arthropathy, unspecified: Secondary | ICD-10-CM | POA: Diagnosis not present

## 2011-12-17 ENCOUNTER — Ambulatory Visit (INDEPENDENT_AMBULATORY_CARE_PROVIDER_SITE_OTHER): Payer: Medicare Other | Admitting: *Deleted

## 2011-12-17 DIAGNOSIS — Z7901 Long term (current) use of anticoagulants: Secondary | ICD-10-CM

## 2011-12-17 DIAGNOSIS — Z8679 Personal history of other diseases of the circulatory system: Secondary | ICD-10-CM

## 2011-12-17 LAB — POCT INR: INR: 3.1

## 2011-12-18 ENCOUNTER — Other Ambulatory Visit: Payer: Self-pay | Admitting: Cardiology

## 2011-12-26 DIAGNOSIS — M129 Arthropathy, unspecified: Secondary | ICD-10-CM | POA: Diagnosis not present

## 2011-12-26 DIAGNOSIS — I4891 Unspecified atrial fibrillation: Secondary | ICD-10-CM | POA: Diagnosis not present

## 2011-12-26 DIAGNOSIS — E876 Hypokalemia: Secondary | ICD-10-CM | POA: Diagnosis not present

## 2011-12-26 DIAGNOSIS — Z79899 Other long term (current) drug therapy: Secondary | ICD-10-CM | POA: Diagnosis not present

## 2012-01-07 ENCOUNTER — Ambulatory Visit (INDEPENDENT_AMBULATORY_CARE_PROVIDER_SITE_OTHER): Payer: Medicare Other | Admitting: *Deleted

## 2012-01-07 DIAGNOSIS — Z8679 Personal history of other diseases of the circulatory system: Secondary | ICD-10-CM | POA: Diagnosis not present

## 2012-01-07 DIAGNOSIS — Z7901 Long term (current) use of anticoagulants: Secondary | ICD-10-CM | POA: Diagnosis not present

## 2012-01-07 LAB — POCT INR: INR: 2.7

## 2012-01-16 ENCOUNTER — Telehealth: Payer: Self-pay | Admitting: Cardiology

## 2012-01-16 ENCOUNTER — Emergency Department (HOSPITAL_COMMUNITY): Payer: Medicare Other

## 2012-01-16 ENCOUNTER — Emergency Department (HOSPITAL_COMMUNITY)
Admission: EM | Admit: 2012-01-16 | Discharge: 2012-01-16 | Disposition: A | Discharge status: Home / Self Care | Payer: Medicare Other | Attending: Emergency Medicine | Admitting: Emergency Medicine

## 2012-01-16 ENCOUNTER — Encounter (HOSPITAL_COMMUNITY): Payer: Self-pay | Admitting: Emergency Medicine

## 2012-01-16 DIAGNOSIS — E119 Type 2 diabetes mellitus without complications: Secondary | ICD-10-CM | POA: Insufficient documentation

## 2012-01-16 DIAGNOSIS — I499 Cardiac arrhythmia, unspecified: Secondary | ICD-10-CM | POA: Diagnosis not present

## 2012-01-16 DIAGNOSIS — Z85038 Personal history of other malignant neoplasm of large intestine: Secondary | ICD-10-CM | POA: Diagnosis not present

## 2012-01-16 DIAGNOSIS — R42 Dizziness and giddiness: Secondary | ICD-10-CM | POA: Diagnosis not present

## 2012-01-16 DIAGNOSIS — I4891 Unspecified atrial fibrillation: Secondary | ICD-10-CM | POA: Insufficient documentation

## 2012-01-16 DIAGNOSIS — I059 Rheumatic mitral valve disease, unspecified: Secondary | ICD-10-CM | POA: Insufficient documentation

## 2012-01-16 DIAGNOSIS — Z7901 Long term (current) use of anticoagulants: Secondary | ICD-10-CM | POA: Insufficient documentation

## 2012-01-16 DIAGNOSIS — I10 Essential (primary) hypertension: Secondary | ICD-10-CM | POA: Diagnosis not present

## 2012-01-16 DIAGNOSIS — E785 Hyperlipidemia, unspecified: Secondary | ICD-10-CM | POA: Diagnosis not present

## 2012-01-16 DIAGNOSIS — E876 Hypokalemia: Secondary | ICD-10-CM | POA: Insufficient documentation

## 2012-01-16 DIAGNOSIS — I2510 Atherosclerotic heart disease of native coronary artery without angina pectoris: Secondary | ICD-10-CM | POA: Insufficient documentation

## 2012-01-16 DIAGNOSIS — F411 Generalized anxiety disorder: Secondary | ICD-10-CM | POA: Insufficient documentation

## 2012-01-16 DIAGNOSIS — R209 Unspecified disturbances of skin sensation: Secondary | ICD-10-CM | POA: Diagnosis not present

## 2012-01-16 DIAGNOSIS — Z853 Personal history of malignant neoplasm of breast: Secondary | ICD-10-CM | POA: Diagnosis not present

## 2012-01-16 DIAGNOSIS — Z79899 Other long term (current) drug therapy: Secondary | ICD-10-CM | POA: Diagnosis not present

## 2012-01-16 DIAGNOSIS — M129 Arthropathy, unspecified: Secondary | ICD-10-CM | POA: Insufficient documentation

## 2012-01-16 LAB — DIFFERENTIAL
Basophils Absolute: 0.1 10*3/uL (ref 0.0–0.1)
Eosinophils Absolute: 0.3 10*3/uL (ref 0.0–0.7)
Eosinophils Relative: 4 % (ref 0–5)
Lymphocytes Relative: 22 % (ref 12–46)
Neutrophils Relative %: 61 % (ref 43–77)

## 2012-01-16 LAB — COMPREHENSIVE METABOLIC PANEL
ALT: 13 U/L (ref 0–35)
AST: 20 U/L (ref 0–37)
CO2: 25 mEq/L (ref 19–32)
Calcium: 10 mg/dL (ref 8.4–10.5)
Potassium: 2.6 mEq/L — CL (ref 3.5–5.1)
Sodium: 138 mEq/L (ref 135–145)
Total Protein: 7.2 g/dL (ref 6.0–8.3)

## 2012-01-16 LAB — URINALYSIS, ROUTINE W REFLEX MICROSCOPIC
Glucose, UA: NEGATIVE mg/dL
Hgb urine dipstick: NEGATIVE
Specific Gravity, Urine: 1.01 (ref 1.005–1.030)

## 2012-01-16 LAB — CBC
MCV: 89.3 fL (ref 78.0–100.0)
Platelets: 290 10*3/uL (ref 150–400)
RDW: 13.5 % (ref 11.5–15.5)
WBC: 6.1 10*3/uL (ref 4.0–10.5)

## 2012-01-16 LAB — PROTIME-INR: Prothrombin Time: 20.7 seconds — ABNORMAL HIGH (ref 11.6–15.2)

## 2012-01-16 MED ORDER — POTASSIUM CHLORIDE 10 MEQ/100ML IV SOLN
10.0000 meq | Freq: Once | INTRAVENOUS | Status: AC
  Administered 2012-01-16: 10 meq via INTRAVENOUS
  Filled 2012-01-16: qty 100

## 2012-01-16 MED ORDER — MECLIZINE HCL 12.5 MG PO TABS
25.0000 mg | ORAL_TABLET | Freq: Once | ORAL | Status: AC
  Administered 2012-01-16: 25 mg via ORAL
  Filled 2012-01-16: qty 2

## 2012-01-16 MED ORDER — MAGNESIUM SULFATE 40 MG/ML IJ SOLN
2.0000 g | INTRAMUSCULAR | Status: AC
  Administered 2012-01-16: 2 g via INTRAVENOUS
  Filled 2012-01-16: qty 50

## 2012-01-16 MED ORDER — POTASSIUM CHLORIDE CRYS ER 20 MEQ PO TBCR
20.0000 meq | EXTENDED_RELEASE_TABLET | Freq: Once | ORAL | Status: AC
  Administered 2012-01-16: 20 meq via ORAL
  Filled 2012-01-16: qty 1

## 2012-01-16 NOTE — ED Notes (Signed)
CRITICAL VALUE ALERT  Critical value received:  K+ 2.6 Date of notification:  01/16/12  Time of notification:  0626 Critical value read back: yes  Nurse who received alert:  Venetia Night   MD notified (1st pagedr miiler  Time of first page: 0626  MD notified (2nd page):  Time of second page:  Responding MD: Dr Hyacinth Meeker  Time MD responded:  [phone]

## 2012-01-16 NOTE — ED Notes (Signed)
Patient ambulated to BR, slightly unsteady but w/out complaints of vertigo during ambulation.   Vertigo only occurred w/position changes and passes within 1-2 mins. During ambulation, she experienced no vertigo.  VSS shown below.

## 2012-01-16 NOTE — ED Notes (Signed)
Patient with no complaints at this time. Respirations even and unlabored. Skin warm/dry. Discharge instructions reviewed with patient at this time. Patient given opportunity to voice concerns/ask questions. IV removed per policy and band-aid applied to site. Patient discharged at this time and left Emergency Department with steady gait.  

## 2012-01-16 NOTE — ED Provider Notes (Signed)
History     CSN: [id number]  Arrival date & time 01/16/12  4098   First MD Initiated Contact with Patient 01/16/12 [phone]      Chief Complaint  Patient presents with  . Chest Pain    (Consider location/radiation/quality/duration/timing/severity/associated sxs/prior treatment) HPI Comments: 78 year old female with a history of atrial fibrillation, hypercholesterolemia,breast and colon cancer, diabetes and anxiety disorder who presents with a complaint of feeling numb all over. The patient states that she went to bed this evening feeling like her normal self, she had been in normal dinner yesterday and had been feeling well with no complaints whatsoever. When she awoke this evening she sat up in bed to go get a drink of water and immediately felt dizzy and "numb all over". She states that she can feel just fine and that she really doesn't to explain the way she feels when she sits up but describes it as numb. This is not unilateral, it is not focal but involves her whole body from her head to both of her teeth. It is worse when she sits up but feels persistent. She does have associated chest pain on the left side of her chest which is poorly described, intermittent only lasts for a few seconds. She has not been having any coughing, shortness of breath, back pain, rashes, dysuria, diarrhea, swelling, headache, blurred vision, weakness. She is on Coumadin. The symptoms were acute in onset just prior to arrival when she sat up in bed.  The history is provided by the patient, the spouse and medical records.    Past Medical History  Diagnosis Date  . PAF (paroxysmal atrial fibrillation)   . HTN (hypertension)   . DM2 (diabetes mellitus, type 2)   . MR (mitral regurgitation)     mild  . CAD (coronary artery disease)     nonobstructive  . Dyslipidemia   . Drug intolerance     intolerant to numerous statins and Zetia  . Anxiety disorder     generalized  . Arthritis   . Breast cancer 1988   s/p right lumpectomy  . Colon cancer 1991    s/p right hemicolectom    Past Surgical History  Procedure Date  . Chronic coumadin     CHAD2:3 (medical hx)  . Colon surgery 1991    rt  . Laproscopic cholecystectomy   . Tonsillectomy   . Cataract extraction w/ intraocular lens  implant, bilateral     no cataract extraction   . Breast lumpectomy 1988    rt  . Appendectomy   . Tonsillectomy   . Cholecystectomy     Family History  Problem Relation Age of Onset  . Coronary artery disease      family hx    History  Substance Use Topics  . Smoking status: Never Smoker   . Smokeless tobacco: Never Used     Comment: tobacco use - no  . Alcohol Use: No    OB History    Grav Para Term Preterm Abortions TAB SAB Ect Mult Living                  Review of Systems  All other systems reviewed and are negative.    Allergies  Alprazolam; Atorvastatin; Azithromycin; Cephalexin; Crestor; Epinephrine; Inderal; Penicillins; Polysporin; Propranolol hcl; Quinapril hcl; Statins; Sulfamethoxazole w-trimethoprim; and Zetia  Home Medications   Current Outpatient Rx  Name  Route  Sig  Dispense  Refill  . VITAMIN D3 400 UNITS PO TABS  Oral   Take 400 Units by mouth daily.           Marland Kitchen DIAZEPAM 5 MG PO TABS   Oral   Take 5 mg by mouth daily.           Marland Kitchen DILTIAZEM HCL ER COATED BEADS 180 MG PO CP24   Oral   Take 180 mg by mouth daily.         Marland Kitchen DILTIAZEM HCL ER COATED BEADS 180 MG PO CP24      TAKE ONE CAPSULE BY MOUTH EVERY DAY   30 capsule   5   . DOXYCYCLINE HYCLATE 100 MG PO TABS   Oral   Take 100 mg by mouth 2 (two) times daily.         Marland Kitchen GEMFIBROZIL 600 MG PO TABS   Oral   Take 1 tablet by mouth Twice daily.         Marland Kitchen GLIPIZIDE ER 5 MG PO TB24   Oral   Take 5 mg by mouth daily.           Marland Kitchen POTASSIUM CHLORIDE CRYS ER 20 MEQ PO TBCR      Take one (1) tablet bid x 2 days, then one tablet a day.   15 tablet   0   . TRIAMTERENE-HCTZ 37.5-25 MG PO  TABS   Oral   Take 1 tablet by mouth daily.           . WARFARIN SODIUM 2.5 MG PO TABS   Oral   Take 2.5 mg by mouth as directed. Take as directed by coumadin clinic:Last checked on 11/05/2011. Take one tablet by mouth every day in the evening. Take two tablets every day in the evening on Sundays only as of September 2013.           BP 166/62  Pulse 58  Temp 97.7 F (36.5 C) (Oral)  Resp 20  SpO2 94%  Physical Exam  Nursing note and vitals reviewed. Constitutional: She appears well-developed and well-nourished. No distress.  HENT:  Head: Normocephalic and atraumatic.  Mouth/Throat: Oropharynx is clear and moist. No oropharyngeal exudate.       Tympanic membranes normal bilaterally  Eyes: Conjunctivae normal and EOM are normal. Pupils are equal, round, and reactive to light. Right eye exhibits no discharge. Left eye exhibits no discharge. No scleral icterus.  Neck: Normal range of motion. Neck supple. No JVD present. No thyromegaly present.       No lymphadenopathy, supple  Cardiovascular: Normal rate, regular rhythm, normal heart sounds and intact distal pulses.  Exam reveals no gallop and no friction rub.   No murmur heard.      Regular rhythm, strong pulses at the radial arteries bilaterally, no murmurs  Pulmonary/Chest: Effort normal and breath sounds normal. No respiratory distress. She has no wheezes. She has no rales.       Clear heart sounds, no distress, no increased work of breathing  Abdominal: Soft. Bowel sounds are normal. She exhibits no distension and no mass. There is no tenderness.       very soft nontender abdomen with normal bowel sounds  Musculoskeletal: Normal range of motion. She exhibits no edema and no tenderness.       No edema, no rashes, normal range of motion of all joints, normal strength in all 4 extremities  Lymphadenopathy:    She has no cervical adenopathy.  Neurological: She is alert. Coordination normal.       Cranial  nerves III through XII  are intact, the patient follows commands without difficulty, her speech is clear, her memory is intact, she is able to grip with bilateral hands, pulse, push with the upper extremities, lift both legs off the bed and hold for 10 seconds on both sides. She has normal sensation to light touch and pinprick in all 4 extremities and her face. There is no pronator drift, no discoordination of the upper or lower extremities. There is no inducible nystagmus  Skin: Skin is warm and dry. No rash noted. No erythema.  Psychiatric: She has a normal mood and affect. Her behavior is normal.    ED Course  Procedures (including critical care time)   Labs Reviewed  PROTIME-INR  APTT  CBC  DIFFERENTIAL  COMPREHENSIVE METABOLIC PANEL  TROPONIN I  URINALYSIS, ROUTINE W REFLEX MICROSCOPIC   No results found.   No diagnosis found.    MDM  The patient overall is a very normal and reassuring exam however when I sit her up in the bed she immediately states that she feels bad, states that her chest hurts, states that she feels numb all over. During this time I am able to examine her and she has normal sensation and motor. Her mentation does not change, she has no inducible nystatin is and I can see no other abnormalities on her exam. Her EKG is completely unchanged from a prior EKG and shows normal sinus rhythm with nonspecific T wave abnormalities but no other significant findings. We'll pursue laboratory workup including urinalysis and a CT scan of the head. She denies that this is her vertigo and has no room spinning feeling. She is not nauseated.  The nurse's notes I been reviewed, I have also reviewed the medical record which shows that she recently saw her cardiologist in August of this year. During that time it was recommended that she stay on Coumadin for chronic atrial fibrillation and it was noted that in 2009 she had a stress test which was negative and 2002 she had clean coronaries by cardiac  catheterization.   ED ECG REPORT  I personally interpreted this EKG   Date: 01/16/2012   Rate: 61  Rhythm: normal sinus rhythm  QRS Axis: normal  Intervals: normal  ST/T Wave abnormalities: nonspecific T wave changes  Conduction Disutrbances:none  Narrative Interpretation:   Old EKG Reviewed: unchanged compared to 11/29/2011   The patient appears well, has reasonable vital signs, has been found to be hypokalemic and despite denying that the room is spinning or that she is moving when she changes position, she has a feeling of abnormal sensation every time she sits up to change position. I suspect that the position of change with a feeling of dizziness could be a vertiginous feeling however there is no nystagmus involved. The patient has been given potassium replacement, magnesium and will give meclizine as well.  At change of shift, care signed out to Dr. Shonna Chock line, imaging pending     Vida Roller, MD 01/16/12 [phone]

## 2012-01-16 NOTE — Telephone Encounter (Signed)
Looks like she should be on lisinopril 5 mg daily.

## 2012-01-16 NOTE — ED Provider Notes (Signed)
I received patient in signout to f/u on imaging CT head negative Pt now improved. She is ambulatory with steady gait, no ataxia She has no focal motor weakness, no new dysarthria, no past pointing EKG reviewed She tolerated magnesium and potassium infusion I doubt occult CVA.  She reports only minimal CP earlier, none at present, doubt ACS Advised to stop her diuretic and call her PCP for further instructions Also advised to have f/u phone call concerning her subtherapeutic inr BP 144/77  Pulse 72  Temp 97.7 F (36.5 C) (Oral)  Resp 18  SpO2 97%   Joya Gaskins, MD 01/16/12 [phone]

## 2012-01-16 NOTE — Telephone Encounter (Signed)
Went to [facility] ED this morning.  Thought she was having problem with her heart, but turned out to be inner ear.  Patient is questioning if she is to be on Lisinopril.  ED nurse mentioned this to her.      ESSENTIAL HYPERTENSION, BENIGN - Marca Ancona, MD 09/29/2011 10:58 PM Signed  BP has been running high. She has not had recent labs. I will check a BMET today. If renal function is ok, I will start her on lisinopril 5 mg daily given HTN in the setting of type II diabetes.   On lab disposition - just has labs okay with no mention of starting any meds.    Please confirm that patient should be on Lisinopril 5mg  daily.  Is not due to follow up again till August 2014.

## 2012-01-16 NOTE — ED Notes (Signed)
Patient presents to ER via [facility] with c/o chest pain and numbness from head to toe.  Patient sat up this morning to get some water and went numb from head to toe.

## 2012-01-16 NOTE — Telephone Encounter (Signed)
Would like to know about lab work she had done Also, said there is a Rx that wasn't called in

## 2012-01-17 NOTE — Telephone Encounter (Signed)
Discussed below with patient.  States that Dr. Dimas Aguas had previously had her on different med with HCTZ in it, but had recently stopped due to low potassium.  Patient states that she is really not sure about taking anything else.  States she has a follow up OV with Dr. Dimas Aguas for Tuesday, November 26 to check potassium.  Advised her to follow up with him & discuss med below since he had made most recent changes.  Advised patient to call office for follow up here if PMD feels she needs to be seen sooner than one year as previously discussed.  She verbalized understanding.

## 2012-01-21 DIAGNOSIS — R42 Dizziness and giddiness: Secondary | ICD-10-CM | POA: Diagnosis not present

## 2012-02-04 ENCOUNTER — Ambulatory Visit (INDEPENDENT_AMBULATORY_CARE_PROVIDER_SITE_OTHER): Payer: Medicare Other | Admitting: *Deleted

## 2012-02-04 DIAGNOSIS — Z7901 Long term (current) use of anticoagulants: Secondary | ICD-10-CM

## 2012-02-04 DIAGNOSIS — Z8679 Personal history of other diseases of the circulatory system: Secondary | ICD-10-CM | POA: Diagnosis not present

## 2012-02-04 LAB — POCT INR: INR: 1.9

## 2012-02-12 DIAGNOSIS — E119 Type 2 diabetes mellitus without complications: Secondary | ICD-10-CM | POA: Diagnosis not present

## 2012-02-12 DIAGNOSIS — H538 Other visual disturbances: Secondary | ICD-10-CM | POA: Diagnosis not present

## 2012-02-12 DIAGNOSIS — Z961 Presence of intraocular lens: Secondary | ICD-10-CM | POA: Diagnosis not present

## 2012-02-24 DIAGNOSIS — L821 Other seborrheic keratosis: Secondary | ICD-10-CM | POA: Diagnosis not present

## 2012-02-24 DIAGNOSIS — D239 Other benign neoplasm of skin, unspecified: Secondary | ICD-10-CM | POA: Diagnosis not present

## 2012-02-24 DIAGNOSIS — D485 Neoplasm of uncertain behavior of skin: Secondary | ICD-10-CM | POA: Diagnosis not present

## 2012-02-24 DIAGNOSIS — L820 Inflamed seborrheic keratosis: Secondary | ICD-10-CM | POA: Diagnosis not present

## 2012-02-25 ENCOUNTER — Ambulatory Visit (INDEPENDENT_AMBULATORY_CARE_PROVIDER_SITE_OTHER): Payer: Medicare Other | Admitting: *Deleted

## 2012-02-25 DIAGNOSIS — Z8679 Personal history of other diseases of the circulatory system: Secondary | ICD-10-CM | POA: Diagnosis not present

## 2012-02-25 DIAGNOSIS — Z7901 Long term (current) use of anticoagulants: Secondary | ICD-10-CM

## 2012-02-25 LAB — POCT INR: INR: 2

## 2012-03-16 ENCOUNTER — Other Ambulatory Visit: Payer: Self-pay | Admitting: Cardiology

## 2012-03-16 MED ORDER — WARFARIN SODIUM 2.5 MG PO TABS: 2.5000 mg | ORAL_TABLET | Freq: Every day | ORAL | Status: DC

## 2012-03-31 ENCOUNTER — Ambulatory Visit (INDEPENDENT_AMBULATORY_CARE_PROVIDER_SITE_OTHER): Payer: Medicare Other | Admitting: *Deleted

## 2012-03-31 DIAGNOSIS — Z7901 Long term (current) use of anticoagulants: Secondary | ICD-10-CM

## 2012-03-31 DIAGNOSIS — Z8679 Personal history of other diseases of the circulatory system: Secondary | ICD-10-CM

## 2012-03-31 LAB — POCT INR: INR: 2.5

## 2012-04-20 DIAGNOSIS — E876 Hypokalemia: Secondary | ICD-10-CM | POA: Diagnosis not present

## 2012-04-24 DIAGNOSIS — R509 Fever, unspecified: Secondary | ICD-10-CM | POA: Diagnosis not present

## 2012-04-24 DIAGNOSIS — N3090 Cystitis, unspecified without hematuria: Secondary | ICD-10-CM | POA: Diagnosis not present

## 2012-04-24 DIAGNOSIS — R300 Dysuria: Secondary | ICD-10-CM | POA: Diagnosis not present

## 2012-04-24 DIAGNOSIS — B9789 Other viral agents as the cause of diseases classified elsewhere: Secondary | ICD-10-CM | POA: Diagnosis not present

## 2012-04-24 DIAGNOSIS — R7309 Other abnormal glucose: Secondary | ICD-10-CM | POA: Diagnosis not present

## 2012-04-24 DIAGNOSIS — J209 Acute bronchitis, unspecified: Secondary | ICD-10-CM | POA: Diagnosis not present

## 2012-04-25 DIAGNOSIS — R509 Fever, unspecified: Secondary | ICD-10-CM | POA: Diagnosis not present

## 2012-05-08 ENCOUNTER — Ambulatory Visit (INDEPENDENT_AMBULATORY_CARE_PROVIDER_SITE_OTHER): Payer: Medicare Other | Admitting: *Deleted

## 2012-05-08 DIAGNOSIS — Z8679 Personal history of other diseases of the circulatory system: Secondary | ICD-10-CM

## 2012-05-08 DIAGNOSIS — Z7901 Long term (current) use of anticoagulants: Secondary | ICD-10-CM | POA: Diagnosis not present

## 2012-05-08 LAB — POCT INR: INR: 1.5

## 2012-05-22 ENCOUNTER — Ambulatory Visit (INDEPENDENT_AMBULATORY_CARE_PROVIDER_SITE_OTHER): Payer: Medicare Other | Admitting: *Deleted

## 2012-05-22 DIAGNOSIS — Z8679 Personal history of other diseases of the circulatory system: Secondary | ICD-10-CM | POA: Diagnosis not present

## 2012-05-22 DIAGNOSIS — Z7901 Long term (current) use of anticoagulants: Secondary | ICD-10-CM | POA: Diagnosis not present

## 2012-05-22 LAB — POCT INR: INR: 1.6

## 2012-06-02 ENCOUNTER — Ambulatory Visit (INDEPENDENT_AMBULATORY_CARE_PROVIDER_SITE_OTHER): Payer: Medicare Other | Admitting: *Deleted

## 2012-06-02 DIAGNOSIS — Z7901 Long term (current) use of anticoagulants: Secondary | ICD-10-CM | POA: Diagnosis not present

## 2012-06-02 DIAGNOSIS — Z8679 Personal history of other diseases of the circulatory system: Secondary | ICD-10-CM

## 2012-06-15 ENCOUNTER — Other Ambulatory Visit: Payer: Self-pay | Admitting: Cardiology

## 2012-06-18 ENCOUNTER — Other Ambulatory Visit: Payer: Self-pay | Admitting: Physician Assistant

## 2012-06-18 DIAGNOSIS — E876 Hypokalemia: Secondary | ICD-10-CM | POA: Diagnosis not present

## 2012-06-18 DIAGNOSIS — E119 Type 2 diabetes mellitus without complications: Secondary | ICD-10-CM | POA: Diagnosis not present

## 2012-06-18 DIAGNOSIS — E7800 Pure hypercholesterolemia, unspecified: Secondary | ICD-10-CM | POA: Diagnosis not present

## 2012-06-18 DIAGNOSIS — E538 Deficiency of other specified B group vitamins: Secondary | ICD-10-CM | POA: Diagnosis not present

## 2012-06-18 DIAGNOSIS — I4891 Unspecified atrial fibrillation: Secondary | ICD-10-CM | POA: Diagnosis not present

## 2012-06-23 ENCOUNTER — Ambulatory Visit (INDEPENDENT_AMBULATORY_CARE_PROVIDER_SITE_OTHER): Payer: Medicare Other | Admitting: *Deleted

## 2012-06-23 DIAGNOSIS — Z8679 Personal history of other diseases of the circulatory system: Secondary | ICD-10-CM

## 2012-06-23 DIAGNOSIS — Z7901 Long term (current) use of anticoagulants: Secondary | ICD-10-CM | POA: Diagnosis not present

## 2012-06-24 DIAGNOSIS — R42 Dizziness and giddiness: Secondary | ICD-10-CM | POA: Diagnosis not present

## 2012-06-24 DIAGNOSIS — E119 Type 2 diabetes mellitus without complications: Secondary | ICD-10-CM | POA: Diagnosis not present

## 2012-06-24 DIAGNOSIS — E7800 Pure hypercholesterolemia, unspecified: Secondary | ICD-10-CM | POA: Diagnosis not present

## 2012-06-24 DIAGNOSIS — M129 Arthropathy, unspecified: Secondary | ICD-10-CM | POA: Diagnosis not present

## 2012-06-24 DIAGNOSIS — E876 Hypokalemia: Secondary | ICD-10-CM | POA: Diagnosis not present

## 2012-06-24 DIAGNOSIS — I4891 Unspecified atrial fibrillation: Secondary | ICD-10-CM | POA: Diagnosis not present

## 2012-06-29 DIAGNOSIS — Z1231 Encounter for screening mammogram for malignant neoplasm of breast: Secondary | ICD-10-CM | POA: Diagnosis not present

## 2012-07-13 ENCOUNTER — Telehealth: Payer: Self-pay | Admitting: Cardiology

## 2012-07-13 NOTE — Telephone Encounter (Signed)
Called and spoke with patient and she c/o tingling in hands and arm. No c/o chest pain or sob, or dizziness. Patient denies seeing blood in stool, urine or nose bleeds. Nurse advised patient that she needed to call her PCP to discuss these symptoms. Patient verbalized understanding of plan.

## 2012-07-13 NOTE — Telephone Encounter (Signed)
Saturday night patient took her coumadin and starting feeling numb in fingers and hands and just didn't feel right.   Requesting to know what she needs to do or if she needs to see doctor

## 2012-07-21 ENCOUNTER — Ambulatory Visit (INDEPENDENT_AMBULATORY_CARE_PROVIDER_SITE_OTHER): Payer: Medicare Other | Admitting: *Deleted

## 2012-07-21 DIAGNOSIS — Z8679 Personal history of other diseases of the circulatory system: Secondary | ICD-10-CM

## 2012-07-21 DIAGNOSIS — Z7901 Long term (current) use of anticoagulants: Secondary | ICD-10-CM | POA: Diagnosis not present

## 2012-07-27 ENCOUNTER — Telehealth: Payer: Self-pay | Admitting: *Deleted

## 2012-07-27 MED ORDER — WARFARIN SODIUM 2.5 MG PO TABS: ORAL_TABLET | ORAL | Status: DC

## 2012-07-27 NOTE — Telephone Encounter (Signed)
needs refill on coumdin states [facility] would not refill

## 2012-08-11 ENCOUNTER — Ambulatory Visit (INDEPENDENT_AMBULATORY_CARE_PROVIDER_SITE_OTHER): Payer: Medicare Other | Admitting: *Deleted

## 2012-08-11 DIAGNOSIS — Z7901 Long term (current) use of anticoagulants: Secondary | ICD-10-CM

## 2012-08-11 DIAGNOSIS — Z8679 Personal history of other diseases of the circulatory system: Secondary | ICD-10-CM | POA: Diagnosis not present

## 2012-08-11 LAB — POCT INR: INR: 2.2

## 2012-09-02 DIAGNOSIS — E119 Type 2 diabetes mellitus without complications: Secondary | ICD-10-CM | POA: Diagnosis not present

## 2012-09-02 DIAGNOSIS — Z961 Presence of intraocular lens: Secondary | ICD-10-CM | POA: Diagnosis not present

## 2012-09-02 DIAGNOSIS — H538 Other visual disturbances: Secondary | ICD-10-CM | POA: Diagnosis not present

## 2012-09-09 ENCOUNTER — Encounter: Payer: Self-pay | Admitting: Cardiovascular Disease

## 2012-09-09 ENCOUNTER — Ambulatory Visit (INDEPENDENT_AMBULATORY_CARE_PROVIDER_SITE_OTHER): Payer: Medicare Other | Admitting: Cardiovascular Disease

## 2012-09-09 VITALS — BP 161/73 | HR 62 | Ht 59.0 in | Wt 121.0 lb

## 2012-09-09 DIAGNOSIS — I4891 Unspecified atrial fibrillation: Secondary | ICD-10-CM | POA: Diagnosis not present

## 2012-09-09 DIAGNOSIS — I10 Essential (primary) hypertension: Secondary | ICD-10-CM

## 2012-09-09 NOTE — Progress Notes (Signed)
Patient ID: Judith Chung, female   DOB: [birth year], 79 y.o.   MRN: [id number]    SUBJECTIVE: Judith Chung is an 79 year old female with a history of paroxysmal atrial fibrillation and atrial flutter. She is on chronic Coumadin therapy and reports no bleeding complications. She has a history of normal LV function and nonobstructive coronary disease by cardiac catheterization 2005. She also had a followup adenosine Cardiolite study with an ejection fraction 64% in March of 2009 with no ischemia.   She has been doing well. She reports no palpitations presyncope or syncope. She reports no orthopnea or PND. She also denies chest pain.  She lost her balance after using the toilet last October 2013, and then fell into the bathtub. She did not lose consciousness.  She no longer takes Gemfibrozil.  SocHx: she has been married for 65 years and she has 2 children (sons).   BP: 161/73 mmHg HR: 62 bpm  PHYSICAL EXAM General: NAD Neck: No JVD, no thyromegaly or thyroid nodule.  Lungs: Clear to auscultation bilaterally with normal respiratory effort. CV: Nondisplaced PMI.  Heart regular S1/S2, no S3/S4, no murmur.  No peripheral edema.  No carotid bruit.  Normal pedal pulses.  Abdomen: Soft, nontender, no hepatosplenomegaly, no distention.  Neurologic: Alert and oriented x 3.  Psych: Normal affect. Extremities: No clubbing or cyanosis.     LABS: Basic Metabolic Panel: No results found for this basename: NA, K, CL, CO2, GLUCOSE, BUN, CREATININE, CALCIUM, MG, PHOS,  in the last 72 hours Liver Function Tests: No results found for this basename: AST, ALT, ALKPHOS, BILITOT, PROT, ALBUMIN,  in the last 72 hours No results found for this basename: LIPASE, AMYLASE,  in the last 72 hours CBC: No results found for this basename: WBC, NEUTROABS, HGB, HCT, MCV, PLT,  in the last 72 hours Cardiac Enzymes: No results found for this basename: CKTOTAL, CKMB, CKMBINDEX, TROPONINI,  in the last 72  hours BNP: No components found with this basename: POCBNP,  D-Dimer: No results found for this basename: DDIMER,  in the last 72 hours Hemoglobin A1C: No results found for this basename: HGBA1C,  in the last 72 hours Fasting Lipid Panel: No results found for this basename: CHOL, HDL, LDLCALC, TRIG, CHOLHDL, LDLDIRECT,  in the last 72 hours Thyroid Function Tests: No results found for this basename: TSH, T4TOTAL, FREET3, T3FREE, THYROIDAB,  in the last 72 hours Anemia Panel: No results found for this basename: VITAMINB12, FOLATE, FERRITIN, TIBC, IRON, RETICCTPCT,  in the last 72 hours   ASSESSMENT AND PLAN: 1. Paroxysmal Atrial Fibrillation/Flutter: will continue Diltiazem and Warfarin. 2. HTN: uncontrolled today, so will have her return for a nurse's visit to recheck BP. If it still elevated, I will prescribe Amlodipine.    Prentice Docker, M.D., F.A.C.C.

## 2012-09-09 NOTE — Patient Instructions (Signed)
Continue all current medications. Nurse visit in approximately 2 weeks for blood pressure check.   Your physician wants you to follow up in:  1 year.  You will receive a reminder letter in the mail one-two months in advance.  If you don't receive a letter, please call our office to schedule the follow up appointment

## 2012-09-11 ENCOUNTER — Ambulatory Visit (INDEPENDENT_AMBULATORY_CARE_PROVIDER_SITE_OTHER): Payer: Medicare Other | Admitting: *Deleted

## 2012-09-11 DIAGNOSIS — Z7901 Long term (current) use of anticoagulants: Secondary | ICD-10-CM

## 2012-09-11 DIAGNOSIS — Z8679 Personal history of other diseases of the circulatory system: Secondary | ICD-10-CM

## 2012-09-23 ENCOUNTER — Ambulatory Visit (INDEPENDENT_AMBULATORY_CARE_PROVIDER_SITE_OTHER): Payer: Medicare Other | Admitting: *Deleted

## 2012-09-23 VITALS — BP 166/74 | HR 57

## 2012-09-23 DIAGNOSIS — I10 Essential (primary) hypertension: Secondary | ICD-10-CM

## 2012-09-23 NOTE — Progress Notes (Signed)
Patient in office this morning for blood pressure check.  BP at last OV was 161/73 on 7/16.   See BP readings under extended vitals section.   Informed pt that message will be sent to MD.

## 2012-09-24 ENCOUNTER — Telehealth: Payer: Self-pay | Admitting: *Deleted

## 2012-09-24 MED ORDER — AMLODIPINE BESYLATE 5 MG PO TABS: 5.0000 mg | ORAL_TABLET | Freq: Every day | ORAL | Status: DC

## 2012-09-24 NOTE — Progress Notes (Signed)
Patient notified.  New rx sent to [facility].

## 2012-09-24 NOTE — Progress Notes (Signed)
Please start Amlodipine 5 mg daily. Thanks!

## 2012-09-24 NOTE — Telephone Encounter (Signed)
Patient notified per Dr. Purvis Sheffield to begin Amlodipine 5mg  daily.  Will send new rx to [facility].

## 2012-09-24 NOTE — Telephone Encounter (Signed)
Message copied by Lesle Chris on Thu Sep 24, 2012 10:28 AM ------      Message from: Prentice Docker A      Created: Thu Sep 24, 2012  8:24 AM                   ----- Message -----         From: Lesle Chris, LPN         Sent: 09/23/2012   9:16 AM           To: Laqueta Linden, MD             ------

## 2012-09-25 ENCOUNTER — Ambulatory Visit (INDEPENDENT_AMBULATORY_CARE_PROVIDER_SITE_OTHER): Payer: Medicare Other | Admitting: *Deleted

## 2012-09-25 DIAGNOSIS — Z8679 Personal history of other diseases of the circulatory system: Secondary | ICD-10-CM

## 2012-09-25 DIAGNOSIS — Z7901 Long term (current) use of anticoagulants: Secondary | ICD-10-CM

## 2012-09-25 LAB — POCT INR: INR: 1.6

## 2012-10-09 ENCOUNTER — Ambulatory Visit (INDEPENDENT_AMBULATORY_CARE_PROVIDER_SITE_OTHER): Payer: Medicare Other | Admitting: *Deleted

## 2012-10-09 DIAGNOSIS — Z7901 Long term (current) use of anticoagulants: Secondary | ICD-10-CM | POA: Diagnosis not present

## 2012-10-09 DIAGNOSIS — Z8679 Personal history of other diseases of the circulatory system: Secondary | ICD-10-CM | POA: Diagnosis not present

## 2012-10-09 MED ORDER — WARFARIN SODIUM 2.5 MG PO TABS: ORAL_TABLET | ORAL | Status: DC

## 2012-10-12 ENCOUNTER — Telehealth: Payer: Self-pay | Admitting: Cardiovascular Disease

## 2012-10-12 ENCOUNTER — Encounter: Payer: Self-pay | Admitting: Cardiovascular Disease

## 2012-10-12 DIAGNOSIS — Z85038 Personal history of other malignant neoplasm of large intestine: Secondary | ICD-10-CM | POA: Diagnosis not present

## 2012-10-12 DIAGNOSIS — Z888 Allergy status to other drugs, medicaments and biological substances status: Secondary | ICD-10-CM | POA: Diagnosis not present

## 2012-10-12 DIAGNOSIS — I4891 Unspecified atrial fibrillation: Secondary | ICD-10-CM | POA: Diagnosis not present

## 2012-10-12 DIAGNOSIS — Z8249 Family history of ischemic heart disease and other diseases of the circulatory system: Secondary | ICD-10-CM | POA: Diagnosis not present

## 2012-10-12 DIAGNOSIS — K5900 Constipation, unspecified: Secondary | ICD-10-CM | POA: Diagnosis not present

## 2012-10-12 DIAGNOSIS — Z853 Personal history of malignant neoplasm of breast: Secondary | ICD-10-CM | POA: Diagnosis not present

## 2012-10-12 DIAGNOSIS — Z880 Allergy status to penicillin: Secondary | ICD-10-CM | POA: Diagnosis not present

## 2012-10-12 DIAGNOSIS — E119 Type 2 diabetes mellitus without complications: Secondary | ICD-10-CM | POA: Diagnosis not present

## 2012-10-12 DIAGNOSIS — I10 Essential (primary) hypertension: Secondary | ICD-10-CM | POA: Diagnosis not present

## 2012-10-12 DIAGNOSIS — Z7901 Long term (current) use of anticoagulants: Secondary | ICD-10-CM | POA: Diagnosis not present

## 2012-10-12 DIAGNOSIS — R079 Chest pain, unspecified: Secondary | ICD-10-CM | POA: Diagnosis not present

## 2012-10-12 DIAGNOSIS — Z79899 Other long term (current) drug therapy: Secondary | ICD-10-CM | POA: Diagnosis not present

## 2012-10-12 DIAGNOSIS — Z882 Allergy status to sulfonamides status: Secondary | ICD-10-CM | POA: Diagnosis not present

## 2012-10-12 NOTE — Telephone Encounter (Signed)
Patient not feeling well. Hard chest pain on both sides had a "explosive feeling" that went away.   Doesn't feel well

## 2012-10-12 NOTE — Telephone Encounter (Signed)
Discussed below with patient.  Stated she has numbness in both arms, chest pain off/on.  Notable slurred speech when talking with patient over phone.  States she doesn't feel well.  Advised pt to go to ED for evaluation.  Stated she did not want to go to [facility].  She did state her husband was there with her and could bring her to hospital.  Advised her that she could go to [facility] if she did not feel comfortable with [facility].  Patient verbalized understanding.

## 2012-10-13 ENCOUNTER — Encounter: Payer: Self-pay | Admitting: Cardiovascular Disease

## 2012-10-13 DIAGNOSIS — R079 Chest pain, unspecified: Secondary | ICD-10-CM

## 2012-10-14 DIAGNOSIS — N3000 Acute cystitis without hematuria: Secondary | ICD-10-CM | POA: Diagnosis not present

## 2012-10-14 DIAGNOSIS — R300 Dysuria: Secondary | ICD-10-CM | POA: Diagnosis not present

## 2012-10-14 DIAGNOSIS — R109 Unspecified abdominal pain: Secondary | ICD-10-CM | POA: Diagnosis not present

## 2012-10-16 ENCOUNTER — Ambulatory Visit (INDEPENDENT_AMBULATORY_CARE_PROVIDER_SITE_OTHER): Payer: Medicare Other | Admitting: *Deleted

## 2012-10-16 DIAGNOSIS — Z8679 Personal history of other diseases of the circulatory system: Secondary | ICD-10-CM | POA: Diagnosis not present

## 2012-10-16 DIAGNOSIS — Z7901 Long term (current) use of anticoagulants: Secondary | ICD-10-CM | POA: Diagnosis not present

## 2012-10-16 LAB — POCT INR: INR: 2.5

## 2012-10-30 ENCOUNTER — Ambulatory Visit (INDEPENDENT_AMBULATORY_CARE_PROVIDER_SITE_OTHER): Payer: Medicare Other | Admitting: *Deleted

## 2012-10-30 DIAGNOSIS — Z8679 Personal history of other diseases of the circulatory system: Secondary | ICD-10-CM

## 2012-10-30 DIAGNOSIS — Z7901 Long term (current) use of anticoagulants: Secondary | ICD-10-CM | POA: Diagnosis not present

## 2012-11-05 DIAGNOSIS — Z23 Encounter for immunization: Secondary | ICD-10-CM | POA: Diagnosis not present

## 2012-11-09 DIAGNOSIS — D485 Neoplasm of uncertain behavior of skin: Secondary | ICD-10-CM | POA: Diagnosis not present

## 2012-11-09 DIAGNOSIS — L570 Actinic keratosis: Secondary | ICD-10-CM | POA: Diagnosis not present

## 2012-11-09 DIAGNOSIS — L821 Other seborrheic keratosis: Secondary | ICD-10-CM | POA: Diagnosis not present

## 2012-11-12 ENCOUNTER — Encounter: Payer: Self-pay | Admitting: Cardiovascular Disease

## 2012-11-12 ENCOUNTER — Ambulatory Visit (INDEPENDENT_AMBULATORY_CARE_PROVIDER_SITE_OTHER): Payer: Medicare Other | Admitting: Cardiovascular Disease

## 2012-11-12 VITALS — BP 156/78 | HR 68 | Ht 59.0 in | Wt 122.0 lb

## 2012-11-12 DIAGNOSIS — I4891 Unspecified atrial fibrillation: Secondary | ICD-10-CM

## 2012-11-12 DIAGNOSIS — E876 Hypokalemia: Secondary | ICD-10-CM

## 2012-11-12 DIAGNOSIS — Z7901 Long term (current) use of anticoagulants: Secondary | ICD-10-CM | POA: Diagnosis not present

## 2012-11-12 DIAGNOSIS — I10 Essential (primary) hypertension: Secondary | ICD-10-CM

## 2012-11-12 DIAGNOSIS — E119 Type 2 diabetes mellitus without complications: Secondary | ICD-10-CM | POA: Diagnosis not present

## 2012-11-12 MED ORDER — AMLODIPINE BESYLATE 10 MG PO TABS: 10.0000 mg | ORAL_TABLET | Freq: Every day | ORAL | Status: DC

## 2012-11-12 NOTE — Progress Notes (Signed)
Patient ID: Judith Chung, female   DOB: 02-Feb-1925, 79 y.o.   MRN: [id number]   SUBJECTIVE: Judith Chung is an 79 year old female with a history of paroxysmal atrial fibrillation and atrial flutter. She is on chronic Coumadin therapy, and denies bleeding problems. She has a history of normal LV function and nonobstructive coronary disease by cardiac catheterization 2005. She then had a followup adenosine Cardiolite study with an ejection fraction 64% in March of 2009 with no ischemia.   I recently started her on amlodipine for HTN.  She was admitted to [facility] in August with chest pain and hypokalemia. She underwent a Lexiscan Cardiolite stress test which showed no evidence of ischemia or scar and was a low-risk study, which I interpreted.  She says her BP stays elevated, but hasn't checked it at home recently. She denies chest pain. She's been trying to eat more potassium-rich foods.     Allergies  Allergen Reactions  . Alprazolam     REACTION: sleepy  . Atorvastatin     REACTION: myalgia  . Azithromycin     REACTION: rash  . Cephalexin     REACTION: rash  . Crestor [Rosuvastatin]   . Epinephrine     REACTION: B/P,chest pain  . Inderal [Propranolol]   . Penicillins     REACTION: rash  . Polysporin [Bacitracin-Polymyxin B]   . Propranolol Hcl     REACTION: rash  . Quinapril Hcl     REACTION: rash  . Statins     REACTION: myalgia  . Sulfamethoxazole W-Trimethoprim   . Zetia [Ezetimibe]     Current Outpatient Prescriptions  Medication Sig Dispense Refill  . amLODipine (NORVASC) 5 MG tablet Take 1 tablet (5 mg total) by mouth daily.  30 tablet  6  . Cholecalciferol (VITAMIN D3) 400 UNITS tablet Take 400 Units by mouth daily.       . cyanocobalamin 500 MCG tablet Take 500 mcg by mouth daily.      . diazepam (VALIUM) 5 MG tablet Take 5 mg by mouth daily as needed. Anxiety.      Marland Kitchen diltiazem (CARDIZEM CD) 180 MG 24 hr capsule TAKE ONE CAPSULE BY MOUTH EVERY DAY  30 capsule  4   . glipiZIDE (GLUCOTROL XL) 5 MG 24 hr tablet Take 5 mg by mouth daily.        . nitroGLYCERIN (NITROSTAT) 0.4 MG SL tablet Place 0.4 mg under the tongue every 5 (five) minutes as needed for chest pain.      Marland Kitchen warfarin (COUMADIN) 2.5 MG tablet Take 2 tablets daily or as directed by coumadin clinic  60 tablet  3   No current facility-administered medications for this visit.    Past Medical History  Diagnosis Date  . PAF (paroxysmal atrial fibrillation)   . HTN (hypertension)   . DM2 (diabetes mellitus, type 2)   . MR (mitral regurgitation)     mild  . CAD (coronary artery disease)     nonobstructive  . Dyslipidemia   . Drug intolerance     intolerant to numerous statins and Zetia  . Anxiety disorder     generalized  . Arthritis   . Breast cancer 1988    s/p right lumpectomy  . Colon cancer 1991    s/p right hemicolectom    Past Surgical History  Procedure Laterality Date  . Chronic coumadin      CHAD2:3 (medical hx)  . Colon surgery  1991    rt  . Laproscopic cholecystectomy    .  Tonsillectomy    . Cataract extraction w/ intraocular lens  implant, bilateral      no cataract extraction   . Breast lumpectomy  1988    rt  . Appendectomy    . Tonsillectomy    . Cholecystectomy      History   Social History  . Marital Status: Married    Spouse Name: N/A    Number of Children: N/A  . Years of Education: N/A   Occupational History  . Not on file.   Social History Main Topics  . Smoking status: Never Smoker   . Smokeless tobacco: Never Used     Comment: tobacco use - no  . Alcohol Use: No  . Drug Use: No  . Sexual Activity: Not on file   Other Topics Concern  . Not on file   Social History Narrative  . No narrative on file     Filed Vitals:   11/12/12 1052  BP: 156/78  Pulse: 68  Height: 4\' 11"  (1.499 m)  Weight: 122 lb (55.339 kg)    PHYSICAL EXAM General: NAD Neck: No JVD, no thyromegaly or thyroid nodule.  Lungs: Clear to auscultation  bilaterally with normal respiratory effort. CV: Nondisplaced PMI.  Heart regular S1/S2, no S3/S4, no murmur.  No peripheral edema.  No carotid bruit.  Normal pedal pulses.  Abdomen: Soft, nontender, no hepatosplenomegaly, no distention.  Neurologic: Alert and oriented x 3.  Psych: Normal affect. Extremities: No clubbing or cyanosis.   ECG: reviewed and available in electronic records.      ASSESSMENT AND PLAN: 1. Paroxysmal atrial fibrillation: currently in a regular rhythm and tolerating warfarin. No changes in therapy. 2. HTN: uncontrolled, so will increase amlodipine to 10 mg daily. 3. Chest pain: resolved. May have been musculoskeletal in etiology, given her hypokalemia at the time. I've encouraged her to eat potassium-rich foods daily to avoid further episodes. She had a normal myocardial perfusion study on 10/13/12,   Prentice Docker, M.D., F.A.C.C.

## 2012-11-12 NOTE — Patient Instructions (Signed)
   Increase Norvasc to 10mg daily - new sent to pharm Continue all other medications.   Your physician wants you to follow up in: 6 months.  You will receive a reminder letter in the mail one-two months in advance.  If you don't receive a letter, please call our office to schedule the follow up appointment   

## 2012-11-16 ENCOUNTER — Telehealth: Payer: Self-pay | Admitting: *Deleted

## 2012-11-16 DIAGNOSIS — R609 Edema, unspecified: Secondary | ICD-10-CM

## 2012-11-16 DIAGNOSIS — I10 Essential (primary) hypertension: Secondary | ICD-10-CM

## 2012-11-16 NOTE — Telephone Encounter (Signed)
Message left on voice mail - leg swelling during day, put on Norvasc & not sure what " Cyanocolobalamin" is on her medication list.    Attempted to return call - left message.

## 2012-11-17 ENCOUNTER — Encounter: Payer: Self-pay | Admitting: *Deleted

## 2012-11-17 MED ORDER — CHLORTHALIDONE 25 MG PO TABS: 12.5000 mg | ORAL_TABLET | Freq: Every day | ORAL | Status: DC

## 2012-11-17 NOTE — Telephone Encounter (Signed)
Legs and feet swelling and not feeling good since starting Amlodipine, noticed after starting about 8-9 days after.   Also, c/o nausea.

## 2012-11-17 NOTE — Telephone Encounter (Signed)
Patient notified.  Will mail lab order & she will try to do at [facility] in [place].

## 2012-11-17 NOTE — Telephone Encounter (Signed)
D/c amlodipine, and add to her "adverse reaction to meds" list. Start chlorthalidone 12.5 mg daily with a BMP check in one week.

## 2012-11-18 ENCOUNTER — Other Ambulatory Visit: Payer: Self-pay | Admitting: *Deleted

## 2012-11-18 MED ORDER — DILTIAZEM HCL ER COATED BEADS 180 MG PO CP24: 180.0000 mg | ORAL_CAPSULE | Freq: Every day | ORAL | Status: DC

## 2012-11-20 ENCOUNTER — Ambulatory Visit (INDEPENDENT_AMBULATORY_CARE_PROVIDER_SITE_OTHER): Payer: Medicare Other | Admitting: *Deleted

## 2012-11-20 DIAGNOSIS — Z7901 Long term (current) use of anticoagulants: Secondary | ICD-10-CM

## 2012-11-20 DIAGNOSIS — Z8679 Personal history of other diseases of the circulatory system: Secondary | ICD-10-CM

## 2012-11-20 LAB — POCT INR: INR: 3

## 2012-11-23 ENCOUNTER — Other Ambulatory Visit: Payer: Self-pay | Admitting: Cardiovascular Disease

## 2012-11-23 ENCOUNTER — Encounter: Payer: Medicare Other | Admitting: Cardiovascular Disease

## 2012-11-23 DIAGNOSIS — R609 Edema, unspecified: Secondary | ICD-10-CM | POA: Diagnosis not present

## 2012-11-23 DIAGNOSIS — I10 Essential (primary) hypertension: Secondary | ICD-10-CM | POA: Diagnosis not present

## 2012-11-23 LAB — BASIC METABOLIC PANEL
BUN: 22 mg/dL (ref 6–23)
Calcium: 9.3 mg/dL (ref 8.4–10.5)
Chloride: 105 mEq/L (ref 96–112)
Creat: 1.11 mg/dL — ABNORMAL HIGH (ref 0.50–1.10)
Sodium: 142 mEq/L (ref 135–145)

## 2012-11-24 ENCOUNTER — Encounter: Payer: Self-pay | Admitting: *Deleted

## 2012-11-26 ENCOUNTER — Telehealth: Payer: Self-pay | Admitting: *Deleted

## 2012-11-26 NOTE — Telephone Encounter (Signed)
Patient calling to report she thinks she is tolerating the Chlorthalidone.  Patient unsure about starting this medication due to sulfa allergy.  Type of reaction was not documented & she could not remember either.  She has now been on med x 3-4 days and everything seems to be okay.  Advised her to call back with any future concerns, she verbalized understanding.    10/1 - 154/80  83  AM           137/75  70  4PM  10/2 - 135/77  61

## 2012-12-01 ENCOUNTER — Telehealth: Payer: Self-pay | Admitting: *Deleted

## 2012-12-01 NOTE — Telephone Encounter (Signed)
Patient called this morning c/o feeling like she can't take the Chlorthalidone.  States she just has not felt right since beginning this medication.  Not sleeping well, nausea/loss of appetite, & arms numb occasionally.  BP this morning was 155 75 & rechecked a little after that wa s146/77.  Heart rate running in the 60's.  Also stated that she really can't tell much difference as far as urinating any more frequently than before.  Informed pt that Dr. Purvis Sheffield is out of the office this week, so message will be sent to provider in office today.

## 2012-12-01 NOTE — Telephone Encounter (Signed)
Patient notified.  Will await Dr. Purvis Sheffield response next week.

## 2012-12-01 NOTE — Telephone Encounter (Signed)
Reviewed recent office note and telephone notes. Would simply have her stop the chlorthalidone for now if she is not tolerating it. Medication adjustments and further considerations can be reviewed by nursing with Dr. Purvis Sheffield on his return next week.

## 2012-12-01 NOTE — Telephone Encounter (Signed)
Left message to return call 

## 2012-12-07 MED ORDER — HYDRALAZINE HCL 25 MG PO TABS: 25.0000 mg | ORAL_TABLET | Freq: Three times a day (TID) | ORAL | Status: DC

## 2012-12-07 NOTE — Telephone Encounter (Signed)
Patient notified.  New rx sent to [facility].  Patient states BP this morning was 145/80.  She will continue to monitor her BP at home and call office back with any future concerns.

## 2012-12-07 NOTE — Telephone Encounter (Signed)
Agree with discontinuation of chlorthalidone, and please add to adverse reactions list for pt. Start hydralazine 25 mg bid for BP control.

## 2012-12-11 ENCOUNTER — Ambulatory Visit (INDEPENDENT_AMBULATORY_CARE_PROVIDER_SITE_OTHER): Payer: Medicare Other | Admitting: *Deleted

## 2012-12-11 DIAGNOSIS — Z7901 Long term (current) use of anticoagulants: Secondary | ICD-10-CM | POA: Diagnosis not present

## 2012-12-11 DIAGNOSIS — Z8679 Personal history of other diseases of the circulatory system: Secondary | ICD-10-CM

## 2012-12-12 ENCOUNTER — Emergency Department (HOSPITAL_COMMUNITY): Payer: Medicare Other

## 2012-12-12 ENCOUNTER — Encounter (HOSPITAL_COMMUNITY): Payer: Self-pay | Admitting: Emergency Medicine

## 2012-12-12 ENCOUNTER — Observation Stay (HOSPITAL_COMMUNITY)
Admission: EM | Admit: 2012-12-12 | Discharge: 2012-12-13 | Disposition: A | Discharge status: Home / Self Care | Payer: Medicare Other | Attending: Internal Medicine | Admitting: Internal Medicine

## 2012-12-12 DIAGNOSIS — F411 Generalized anxiety disorder: Secondary | ICD-10-CM | POA: Diagnosis not present

## 2012-12-12 DIAGNOSIS — I2510 Atherosclerotic heart disease of native coronary artery without angina pectoris: Secondary | ICD-10-CM | POA: Diagnosis not present

## 2012-12-12 DIAGNOSIS — I4891 Unspecified atrial fibrillation: Secondary | ICD-10-CM | POA: Insufficient documentation

## 2012-12-12 DIAGNOSIS — R079 Chest pain, unspecified: Secondary | ICD-10-CM | POA: Diagnosis not present

## 2012-12-12 DIAGNOSIS — I10 Essential (primary) hypertension: Secondary | ICD-10-CM | POA: Insufficient documentation

## 2012-12-12 DIAGNOSIS — R0602 Shortness of breath: Secondary | ICD-10-CM | POA: Diagnosis not present

## 2012-12-12 DIAGNOSIS — R0789 Other chest pain: Secondary | ICD-10-CM | POA: Diagnosis not present

## 2012-12-12 DIAGNOSIS — E119 Type 2 diabetes mellitus without complications: Secondary | ICD-10-CM | POA: Insufficient documentation

## 2012-12-12 DIAGNOSIS — Z7901 Long term (current) use of anticoagulants: Secondary | ICD-10-CM

## 2012-12-12 LAB — CBC WITH DIFFERENTIAL/PLATELET
Basophils Absolute: 0.1 10*3/uL (ref 0.0–0.1)
Basophils Relative: 1 % (ref 0–1)
Eosinophils Absolute: 0.1 10*3/uL (ref 0.0–0.7)
Hemoglobin: 13.5 g/dL (ref 12.0–15.0)
MCH: 29.4 pg (ref 26.0–34.0)
MCHC: 33.3 g/dL (ref 30.0–36.0)
MCV: 88.2 fL (ref 78.0–100.0)
Monocytes Relative: 9 % (ref 3–12)
Neutrophils Relative %: 70 % (ref 43–77)

## 2012-12-12 LAB — BASIC METABOLIC PANEL
BUN: 27 mg/dL — ABNORMAL HIGH (ref 6–23)
Calcium: 10 mg/dL (ref 8.4–10.5)
Creatinine, Ser: 1.22 mg/dL — ABNORMAL HIGH (ref 0.50–1.10)
GFR calc Af Amer: 45 mL/min — ABNORMAL LOW (ref >90)
GFR calc non Af Amer: 39 mL/min — ABNORMAL LOW (ref >90)
Potassium: 3.9 mEq/L (ref 3.5–5.1)

## 2012-12-12 LAB — GLUCOSE, CAPILLARY: Glucose-Capillary: 117 mg/dL — ABNORMAL HIGH (ref 70–99)

## 2012-12-12 LAB — PROTIME-INR: Prothrombin Time: 23.8 seconds — ABNORMAL HIGH (ref 11.6–15.2)

## 2012-12-12 MED ORDER — ASPIRIN 81 MG PO CHEW
324.0000 mg | CHEWABLE_TABLET | Freq: Once | ORAL | Status: AC
  Administered 2012-12-12: 324 mg via ORAL
  Filled 2012-12-12: qty 4

## 2012-12-12 MED ORDER — SODIUM CHLORIDE 0.9 % IJ SOLN
3.0000 mL | Freq: Two times a day (BID) | INTRAMUSCULAR | Status: DC
  Administered 2012-12-12 – 2012-12-13 (×2): 3 mL via INTRAVENOUS

## 2012-12-12 MED ORDER — NITROGLYCERIN 0.4 MG SL SUBL
0.4000 mg | SUBLINGUAL_TABLET | SUBLINGUAL | Status: DC | PRN
  Administered 2012-12-12: 0.4 mg via SUBLINGUAL
  Filled 2012-12-12: qty 25

## 2012-12-12 MED ORDER — HYDRALAZINE HCL 25 MG PO TABS
25.0000 mg | ORAL_TABLET | Freq: Three times a day (TID) | ORAL | Status: DC
  Administered 2012-12-12 – 2012-12-13 (×2): 25 mg via ORAL
  Filled 2012-12-12 (×7): qty 1

## 2012-12-12 MED ORDER — VITAMIN D3 10 MCG (400 UNIT) PO TABS: 400.0000 [IU] | ORAL_TABLET | Freq: Every day | ORAL | Status: DC

## 2012-12-12 MED ORDER — ASPIRIN EC 325 MG PO TBEC
325.0000 mg | DELAYED_RELEASE_TABLET | Freq: Every day | ORAL | Status: DC
  Administered 2012-12-13: 325 mg via ORAL
  Filled 2012-12-12 (×2): qty 1

## 2012-12-12 MED ORDER — VITAMIN B-12 1000 MCG PO TABS
500.0000 ug | ORAL_TABLET | Freq: Every day | ORAL | Status: DC
  Administered 2012-12-13: 500 ug via ORAL
  Filled 2012-12-12 (×3): qty 1

## 2012-12-12 MED ORDER — INSULIN ASPART 100 UNIT/ML ~~LOC~~ SOLN: 0.0000 [IU] | Freq: Every day | SUBCUTANEOUS | Status: DC

## 2012-12-12 MED ORDER — DILTIAZEM HCL ER COATED BEADS 180 MG PO CP24
180.0000 mg | ORAL_CAPSULE | Freq: Every day | ORAL | Status: DC
  Administered 2012-12-13: 180 mg via ORAL
  Filled 2012-12-12 (×3): qty 1

## 2012-12-12 MED ORDER — SODIUM CHLORIDE 0.9 % IV SOLN
INTRAVENOUS | Status: AC
  Administered 2012-12-12: 21:00:00 via INTRAVENOUS

## 2012-12-12 MED ORDER — WARFARIN SODIUM 5 MG PO TABS
5.0000 mg | ORAL_TABLET | Freq: Once | ORAL | Status: AC
  Administered 2012-12-12: 5 mg via ORAL
  Filled 2012-12-12: qty 1

## 2012-12-12 MED ORDER — DIAZEPAM 5 MG PO TABS: 5.0000 mg | ORAL_TABLET | Freq: Every day | ORAL | Status: DC | PRN

## 2012-12-12 MED ORDER — NITROGLYCERIN 0.4 MG SL SUBL: 0.4000 mg | SUBLINGUAL_TABLET | SUBLINGUAL | Status: DC | PRN

## 2012-12-12 MED ORDER — MORPHINE SULFATE 2 MG/ML IJ SOLN: 1.0000 mg | INTRAMUSCULAR | Status: DC | PRN

## 2012-12-12 MED ORDER — WARFARIN - PHARMACIST DOSING INPATIENT: Freq: Every day | Status: DC

## 2012-12-12 MED ORDER — INSULIN ASPART 100 UNIT/ML ~~LOC~~ SOLN: 0.0000 [IU] | Freq: Three times a day (TID) | SUBCUTANEOUS | Status: DC

## 2012-12-12 MED ORDER — CHOLECALCIFEROL 10 MCG (400 UNIT) PO TABS
400.0000 [IU] | ORAL_TABLET | Freq: Every day | ORAL | Status: DC
  Administered 2012-12-13: 400 [IU] via ORAL
  Filled 2012-12-12 (×4): qty 1

## 2012-12-12 NOTE — H&P (Signed)
Hospitalist Admission History and Physical  Patient name: Judith Chung Medical record number: [id number] Date of birth: 1925-01-06 Age: 79 y.o. Gender: female  Primary Care Provider: Selinda Flavin, MD  Chief Complaint: chest pain  History of Present Illness:This is a 79 y.o. year old female with past medical history of hypertension, hyperlipidemia, A. fib on Coumadin, not short of coronary artery disease presented today with chest pain. Patient states she noticed progressive onset of left-sided chest pain that radiates to the neck and shoulder earlier today. Pain was fairly constant and occurred at rest. Patient states she took a nitroglycerin and this seemed to improve symptoms. The patient went on throughout her day and had a second episode 4-5 hours later with similar morphology. Patient took another nitroglycerin with improvement in symptoms. A third episode occurred the patient decided to come to the ER. Per patient, she had a similar episode of chest pain that occurred 2-4 weeks ago. She was admitted to [facility] where she had a normal Myoview at that time. Patient is followed by [facility] cardiology in [place]. Patient states the pain is a similar distribution/morphology. Patient denies any recent changes activity. No heavy lifting. No shortness of breath. No diaphoresis or nausea. Chest pain is 4/10 and is worse. In the ER, patient had a EKG that showed normal sinus rhythm. First set of troponins within normal limits. Chest x-ray without any focal abnormalities.  Patient Active Problem List   Diagnosis Date Noted  . Encounter for long-term (current) use of anticoagulants 06/04/2010  . DM 04/25/2009  . DYSLIPIDEMIA 04/25/2009  . ESSENTIAL HYPERTENSION, BENIGN 04/25/2009  . ATRIAL FIBRILLATION, PAROXYSMAL, HX OF 04/25/2009   Past Medical History: Past Medical History  Diagnosis Date  . PAF (paroxysmal atrial fibrillation)   . HTN (hypertension)   . DM2 (diabetes mellitus, type 2)    . MR (mitral regurgitation)     mild  . CAD (coronary artery disease)     nonobstructive  . Dyslipidemia   . Drug intolerance     intolerant to numerous statins and Zetia  . Anxiety disorder     generalized  . Arthritis   . Breast cancer 1988    s/p right lumpectomy  . Colon cancer 1991    s/p right hemicolectom    Past Surgical History: Past Surgical History  Procedure Laterality Date  . Chronic coumadin      CHAD2:3 (medical hx)  . Colon surgery  1991    rt  . Laproscopic cholecystectomy    . Tonsillectomy    . Cataract extraction w/ intraocular lens  implant, bilateral      no cataract extraction   . Breast lumpectomy  1988    rt  . Appendectomy    . Tonsillectomy    . Cholecystectomy    . Cardiac catheterization  2005    [place]: nonobstructive coronary disease, normal LV function    Social History: History   Social History  . Marital Status: Married    Spouse Name: N/A    Number of Children: N/A  . Years of Education: N/A   Social History Main Topics  . Smoking status: Never Smoker   . Smokeless tobacco: Never Used     Comment: tobacco use - no  . Alcohol Use: No  . Drug Use: No  . Sexual Activity: None   Other Topics Concern  . None   Social History Narrative  . None    Family History: Family History  Problem Relation Age  of Onset  . Coronary artery disease      family hx    Allergies: Allergies  Allergen Reactions  . Alprazolam     REACTION: sleepy  . Amlodipine Other (See Comments)    Edema feet & legs, nausea  . Atorvastatin     REACTION: myalgia  . Azithromycin     REACTION: rash  . Cephalexin     REACTION: rash  . Chlorthalidone     Nausea, loss of appetite, insomnia, generally did not feel well  . Crestor [Rosuvastatin]   . Epinephrine     REACTION: B/P,chest pain  . Inderal [Propranolol]   . Norvasc [Amlodipine Besylate]   . Penicillins     REACTION: rash  . Polysporin [Bacitracin-Polymyxin B]   . Premarin  [Conjugated Estrogens]     rash  . Propranolol Hcl     REACTION: rash  . Quinapril Hcl     REACTION: rash  . Statins     REACTION: myalgia  . Sulfamethoxazole-Trimethoprim   . Zetia [Ezetimibe]     Current Facility-Administered Medications  Medication Dose Route Frequency Provider Last Rate Last Dose  . aspirin EC tablet 325 mg  325 mg Oral Daily Doree Albee, MD      . cyanocobalamin tablet 500 mcg  500 mcg Oral Daily Doree Albee, MD      . diazepam (VALIUM) tablet 5 mg  5 mg Oral Daily PRN Doree Albee, MD      . diltiazem (CARDIZEM CD) 24 hr capsule 180 mg  180 mg Oral Daily Doree Albee, MD      . hydrALAZINE (APRESOLINE) tablet 25 mg  25 mg Oral TID Doree Albee, MD      . insulin aspart (novoLOG) injection 0-5 Units  0-5 Units Subcutaneous QHS Doree Albee, MD      . Melene Muller ON 12/13/2012] insulin aspart (novoLOG) injection 0-9 Units  0-9 Units Subcutaneous TID WC Doree Albee, MD      . morphine 2 MG/ML injection 1-2 mg  1-2 mg Intravenous Q2H PRN Doree Albee, MD      . nitroGLYCERIN (NITROSTAT) SL tablet 0.4 mg  0.4 mg Sublingual Q5 min PRN Laray Anger, DO   0.4 mg at 12/12/12 1932  . nitroGLYCERIN (NITROSTAT) SL tablet 0.4 mg  0.4 mg Sublingual Q5 min PRN Doree Albee, MD      . sodium chloride 0.9 % injection 3 mL  3 mL Intravenous Q12H Doree Albee, MD      . Vitamin D3 400 Units  400 Units Oral Daily Doree Albee, MD       Current Outpatient Prescriptions  Medication Sig Dispense Refill  . Cholecalciferol (VITAMIN D3) 400 UNITS tablet Take 400 Units by mouth daily.       . cyanocobalamin 500 MCG tablet Take 500 mcg by mouth daily.      . diazepam (VALIUM) 5 MG tablet Take 5 mg by mouth daily as needed. Anxiety.      Marland Kitchen diltiazem (CARDIZEM CD) 180 MG 24 hr capsule Take 1 capsule (180 mg total) by mouth daily.  30 capsule  6  . glipiZIDE (GLUCOTROL XL) 5 MG 24 hr tablet Take 5 mg by mouth daily.        . hydrALAZINE (APRESOLINE) 25 MG tablet Take 1 tablet  (25 mg total) by mouth 3 (three) times daily.  90 tablet  6  . nitroGLYCERIN (NITROSTAT) 0.4 MG SL tablet Place 0.4 mg under the tongue every 5 (five) minutes as  needed for chest pain.      Marland Kitchen warfarin (COUMADIN) 2.5 MG tablet Take 5 mg by mouth daily at 6 PM.       Review Of Systems: 12 point ROS negative except as noted above in HPI.  Physical Exam: Filed Vitals:   12/12/12 1948  BP: 167/72  Pulse: 67  Temp:   Resp: 16    General: alert and cooperative HEENT: PERRLA and extra ocular movement intact Heart: S1, S2 normal, no murmur, rub or gallop, regular rate and rhythm, + L sided chest wall TTP Lungs: clear to auscultation, no wheezes or rales and unlabored breathing Abdomen: abdomen is soft without significant tenderness, masses, organomegaly or guarding Extremities: extremities normal, atraumatic, no cyanosis or edema Skin:no rashes, no ecchymoses Neurology: normal without focal findings  Labs and Imaging: Lab Results  Component Value Date/Time   NA 137 12/12/2012  6:04 PM   K 3.9 12/12/2012  6:04 PM   CL 99 12/12/2012  6:04 PM   CO2 25 12/12/2012  6:04 PM   BUN 27* 12/12/2012  6:04 PM   CREATININE 1.22* 12/12/2012  6:04 PM   CREATININE 1.11* 11/23/2012 10:54 AM   GLUCOSE 90 12/12/2012  6:04 PM   Lab Results  Component Value Date   WBC 9.1 12/12/2012   HGB 13.5 12/12/2012   HCT 40.5 12/12/2012   MCV 88.2 12/12/2012   PLT 260 12/12/2012  Dg Chest 2 View  12/12/2012   CLINICAL DATA:  Chest pain, shortness of Breath.  EXAM: CHEST  2 VIEW  COMPARISON:  10/12/2012  FINDINGS: The heart size and mediastinal contours are within normal limits. Both lungs are clear. The visualized skeletal structures are unremarkable.  IMPRESSION: No active cardiopulmonary disease.   Electronically Signed   By: Charlett Nose M.D.   On: 12/12/2012 18:53     Assessment and Plan: DAIANNA VASQUES is a 79 y.o. year old female presenting with chest pain   Chest pain: Symptoms fairly typical  patient with known history of coronary artery disease. EKG and troponins within normal limits currently. Normal Myoview within the last month is also reassuring. Discussed case with on-call physician for [facility] cardiology ([place]). Feels it is reasonable for patient to be ruled out here at [facility] (cardiology not available on weekends). Cycle cardiac enzymes. Risk stratification labs including TSH, A1c, lipid profile. When necessary nitroglycerin. Start PPI. Given chest wall tenderness to palpation, costochondritis is also in the differential diagnosis. Morphine and Tylenol for pain control.  Cardiovascular: INR at goal. Coumadin per pharmacy. Continue home hypertension meds. Mildly elevated. Noted multiple recent blood pressure medication reactions. We'll follow pressures in house and titrate her meds as needed.  Diabetes: Sliding scale insulin  FEN/GI: Heart healthy/heart modified diet. Start PPI. Noted BUN/creatinine ratio of 20-1 on chemistries though clinically euvolemic. Oral rehydration and reassess in the morning. Prophylaxis: Coumadin per pharmacy Disposition: Pending further evaluation Code Status: Full code       Doree Albee MD  Pager: [phone]

## 2012-12-12 NOTE — ED Provider Notes (Signed)
CSN: [id number]     Arrival date & time 12/12/12  1742 History   First MD Initiated Contact with Patient 12/12/12 1751     Chief Complaint  Patient presents with  . Chest Pain   HPI Pt was seen at 1755. Per pt, c/o gradual onset and persistence of multiple intermittent episodes of left sided chest "pain" since this afternoon. States she had "sharp shooting pain" that lasted for several seconds, but also has had left sided "heaviness" and "pressure" radiating into her left arm since this morning. Pt states she took her own SL ntg with relief of her symptoms. States she did not take an ASA. Denies SOB/cough, no palpitations, no back pain, no abd pain, no N/V/D, no fevers, no rash, no injury.    Past Medical History  Diagnosis Date  . PAF (paroxysmal atrial fibrillation)   . HTN (hypertension)   . DM2 (diabetes mellitus, type 2)   . MR (mitral regurgitation)     mild  . CAD (coronary artery disease)     nonobstructive  . Dyslipidemia   . Drug intolerance     intolerant to numerous statins and Zetia  . Anxiety disorder     generalized  . Arthritis   . Breast cancer 1988    s/p right lumpectomy  . Colon cancer 1991    s/p right hemicolectom   Past Surgical History  Procedure Laterality Date  . Chronic coumadin      CHAD2:3 (medical hx)  . Colon surgery  1991    rt  . Laproscopic cholecystectomy    . Tonsillectomy    . Cataract extraction w/ intraocular lens  implant, bilateral      no cataract extraction   . Breast lumpectomy  1988    rt  . Appendectomy    . Tonsillectomy    . Cholecystectomy    . Cardiac catheterization  2005    [place]: nonobstructive coronary disease, normal LV function   Family History  Problem Relation Age of Onset  . Coronary artery disease      family hx   History  Substance Use Topics  . Smoking status: Never Smoker   . Smokeless tobacco: Never Used     Comment: tobacco use - no  . Alcohol Use: No    Review of Systems ROS: Statement:  All systems negative except as marked or noted in the HPI; Constitutional: Negative for fever and chills. ; ; Eyes: Negative for eye pain, redness and discharge. ; ; ENMT: Negative for ear pain, hoarseness, nasal congestion, sinus pressure and sore throat. ; ; Cardiovascular: +CP. Negative for palpitations, diaphoresis, dyspnea and peripheral edema. ; ; Respiratory: Negative for cough, wheezing and stridor. ; ; Gastrointestinal: Negative for nausea, vomiting, diarrhea, abdominal pain, blood in stool, hematemesis, jaundice and rectal bleeding. . ; ; Genitourinary: Negative for dysuria, flank pain and hematuria. ; ; Musculoskeletal: Negative for back pain and neck pain. Negative for swelling and trauma.; ; Skin: Negative for pruritus, rash, abrasions, blisters, bruising and skin lesion.; ; Neuro: Negative for headache, lightheadedness and neck stiffness. Negative for weakness, altered level of consciousness , altered mental status, extremity weakness, paresthesias, involuntary movement, seizure and syncope.      Allergies  Alprazolam; Amlodipine; Atorvastatin; Azithromycin; Cephalexin; Chlorthalidone; Crestor; Epinephrine; Inderal; Norvasc; Penicillins; Polysporin; Premarin; Propranolol hcl; Quinapril hcl; Statins; Sulfamethoxazole-trimethoprim; and Zetia  Home Medications   Current Outpatient Rx  Name  Route  Sig  Dispense  Refill  . Cholecalciferol (VITAMIN D3) 400  UNITS tablet   Oral   Take 400 Units by mouth daily.          . cyanocobalamin 500 MCG tablet   Oral   Take 500 mcg by mouth daily.         . diazepam (VALIUM) 5 MG tablet   Oral   Take 5 mg by mouth daily as needed. Anxiety.         Marland Kitchen diltiazem (CARDIZEM CD) 180 MG 24 hr capsule   Oral   Take 1 capsule (180 mg total) by mouth daily.   30 capsule   6   . glipiZIDE (GLUCOTROL XL) 5 MG 24 hr tablet   Oral   Take 5 mg by mouth daily.           . hydrALAZINE (APRESOLINE) 25 MG tablet   Oral   Take 1 tablet (25 mg  total) by mouth 3 (three) times daily.   90 tablet   6     Stop Chlorthalidone, med changed 12/07/2012   . nitroGLYCERIN (NITROSTAT) 0.4 MG SL tablet   Sublingual   Place 0.4 mg under the tongue every 5 (five) minutes as needed for chest pain.         Marland Kitchen warfarin (COUMADIN) 2.5 MG tablet   Oral   Take 5 mg by mouth daily at 6 PM.          BP 184/72  Pulse 63  Temp(Src) 97.6 F (36.4 C) (Oral)  Resp 15  SpO2 95% Physical Exam 1800: Physical examination:  Nursing notes reviewed; Vital signs and O2 SAT reviewed;  Constitutional: Well developed, Well nourished, Well hydrated, In no acute distress; Head:  Normocephalic, atraumatic; Eyes: EOMI, PERRL, No scleral icterus; ENMT: Mouth and pharynx normal, Mucous membranes moist; Neck: Supple, Full range of motion, No lymphadenopathy; Cardiovascular: Regular rate and rhythm, No gallop; Respiratory: Breath sounds clear & equal bilaterally, No rales, rhonchi, wheezes.  Speaking full sentences with ease, Normal respiratory effort/excursion; Chest: Nontender, Movement normal; Abdomen: Soft, Nontender, Nondistended, Normal bowel sounds; Genitourinary: No CVA tenderness; Extremities: Pulses normal, No tenderness, No edema, No calf edema or asymmetry.; Neuro: AA&Ox3, Major CN grossly intact.  Speech clear. No gross focal motor or sensory deficits in extremities.; Skin: Color normal, Warm, Dry.   ED Course  Procedures     EKG Interpretation     Ventricular Rate:  62 PR Interval:  144 QRS Duration: 84 QT Interval:  450 QTC Calculation: 456 R Axis:   24 Text Interpretation:  Normal sinus rhythm Minimal voltage criteria for LVH, may be normal variant Borderline ECG When compared with ECG of 16-Jan-2012 05:31, Nonspecific T wave abnormality no longer evident in Inferior leads Nonspecific T wave abnormality no longer evident in Anterolateral leads            MDM  MDM Reviewed: previous chart, nursing note and vitals Reviewed  previous: labs and ECG Interpretation: labs, ECG and x-ray   Results for orders placed during the hospital encounter of 12/12/12  PROTIME-INR      Result Value Range   Prothrombin Time 23.8 (*) 11.6 - 15.2 seconds   INR 2.21 (*) 0.00 - 1.49  CBC WITH DIFFERENTIAL      Result Value Range   WBC 9.1  4.0 - 10.5 K/uL   RBC 4.59  3.87 - 5.11 MIL/uL   Hemoglobin 13.5  12.0 - 15.0 g/dL   HCT 40.9  81.1 - 91.4 %   MCV 88.2  78.0 -  100.0 fL   MCH 29.4  26.0 - 34.0 pg   MCHC 33.3  30.0 - 36.0 g/dL   RDW 81.1  91.4 - 78.2 %   Platelets 260  150 - 400 K/uL   Neutrophils Relative % 70  43 - 77 %   Neutro Abs 6.4  1.7 - 7.7 K/uL   Lymphocytes Relative 18  12 - 46 %   Lymphs Abs 1.7  0.7 - 4.0 K/uL   Monocytes Relative 9  3 - 12 %   Monocytes Absolute 0.8  0.1 - 1.0 K/uL   Eosinophils Relative 2  0 - 5 %   Eosinophils Absolute 0.1  0.0 - 0.7 K/uL   Basophils Relative 1  0 - 1 %   Basophils Absolute 0.1  0.0 - 0.1 K/uL  BASIC METABOLIC PANEL      Result Value Range   Sodium 137  135 - 145 mEq/L   Potassium 3.9  3.5 - 5.1 mEq/L   Chloride 99  96 - 112 mEq/L   CO2 25  19 - 32 mEq/L   Glucose, Bld 90  70 - 99 mg/dL   BUN 27 (*) 6 - 23 mg/dL   Creatinine, Ser 9.56 (*) 0.50 - 1.10 mg/dL   Calcium 21.3  8.4 - 08.6 mg/dL   GFR calc non Af Amer 39 (*) >90 mL/min   GFR calc Af Amer 45 (*) >90 mL/min  TROPONIN I      Result Value Range   Troponin I <0.30  <0.30 ng/mL   Dg Chest 2 View 12/12/2012   CLINICAL DATA:  Chest pain, shortness of Breath.  EXAM: CHEST  2 VIEW  COMPARISON:  10/12/2012  FINDINGS: The heart size and mediastinal contours are within normal limits. Both lungs are clear. The visualized skeletal structures are unremarkable.  IMPRESSION: No active cardiopulmonary disease.   Electronically Signed   By: Charlett Nose M.D.   On: 12/12/2012 18:53    1940: Pt given ASA on arrival to ED. Initially denied any chest pain, but now c/o left sided chest "pressure" radiating into her left  arm. Will dose SL ntg. EKG unchanged from previous and initial troponin normal. TIMI 3-4 by hx, will admit. Dx and testing d/w pt and family.  Questions answered.  Verb understanding, agreeable to admit. T/C to [facility] Dr. Alvester Morin, case discussed, including:  HPI, pertinent PM/SHx, VS/PE, dx testing, ED course and treatment:  Agreeable to observation admit, requests to write temporary orders, obtain tele bed.   Laray Anger, DO 12/15/12 1037

## 2012-12-12 NOTE — ED Notes (Signed)
Pt c/o sharp shooting chest pain that started today around 3pm.   Pt says she took a nitro and rested for a while.  Reports pain went away but then came back.  Pt presently denies any chest pain.  Pt says left arm feels "funny."

## 2012-12-12 NOTE — ED Notes (Signed)
Patient states does not want another nitroglycerin tablet at this time.

## 2012-12-12 NOTE — ED Notes (Signed)
Patient given 4 oz orange juice to drink.

## 2012-12-13 DIAGNOSIS — E119 Type 2 diabetes mellitus without complications: Secondary | ICD-10-CM | POA: Diagnosis not present

## 2012-12-13 DIAGNOSIS — I10 Essential (primary) hypertension: Secondary | ICD-10-CM

## 2012-12-13 DIAGNOSIS — I4891 Unspecified atrial fibrillation: Secondary | ICD-10-CM | POA: Diagnosis not present

## 2012-12-13 DIAGNOSIS — Z7901 Long term (current) use of anticoagulants: Secondary | ICD-10-CM | POA: Diagnosis not present

## 2012-12-13 DIAGNOSIS — R0789 Other chest pain: Secondary | ICD-10-CM | POA: Diagnosis not present

## 2012-12-13 DIAGNOSIS — R079 Chest pain, unspecified: Secondary | ICD-10-CM | POA: Diagnosis not present

## 2012-12-13 LAB — CBC
Hemoglobin: 12.5 g/dL (ref 12.0–15.0)
MCHC: 32.9 g/dL (ref 30.0–36.0)
MCV: 89 fL (ref 78.0–100.0)
RBC: 4.27 MIL/uL (ref 3.87–5.11)
RDW: 14.1 % (ref 11.5–15.5)
WBC: 7.2 10*3/uL (ref 4.0–10.5)

## 2012-12-13 LAB — TROPONIN I
Troponin I: <0.3 ng/mL (ref <0.30)
Troponin I: <0.3 ng/mL (ref <0.30)
Troponin I: <0.3 ng/mL (ref <0.30)

## 2012-12-13 LAB — COMPREHENSIVE METABOLIC PANEL
ALT: 12 U/L (ref 0–35)
AST: 17 U/L (ref 0–37)
BUN: 20 mg/dL (ref 6–23)
CO2: 26 mEq/L (ref 19–32)
Calcium: 8.7 mg/dL (ref 8.4–10.5)
GFR calc Af Amer: 62 mL/min — ABNORMAL LOW (ref >90)
GFR calc non Af Amer: 53 mL/min — ABNORMAL LOW (ref >90)
Glucose, Bld: 68 mg/dL — ABNORMAL LOW (ref 70–99)
Sodium: 142 mEq/L (ref 135–145)
Total Protein: 6.1 g/dL (ref 6.0–8.3)

## 2012-12-13 LAB — HEMOGLOBIN A1C: Mean Plasma Glucose: 128 mg/dL — ABNORMAL HIGH (ref <117)

## 2012-12-13 LAB — TSH: TSH: 1.838 u[IU]/mL (ref 0.350–4.500)

## 2012-12-13 LAB — PROTIME-INR
INR: 2.18 — ABNORMAL HIGH (ref 0.00–1.49)
Prothrombin Time: 23.6 seconds — ABNORMAL HIGH (ref 11.6–15.2)

## 2012-12-13 MED ORDER — WARFARIN SODIUM 5 MG PO TABS: 5.0000 mg | ORAL_TABLET | Freq: Once | ORAL | Status: DC

## 2012-12-13 MED ORDER — ASPIRIN EC 81 MG PO TBEC: 81.0000 mg | DELAYED_RELEASE_TABLET | Freq: Every day | ORAL | Status: DC

## 2012-12-13 MED ORDER — GLUCERNA SHAKE PO LIQD
1.0000 | Freq: Two times a day (BID) | ORAL | Status: DC
  Administered 2012-12-13: 237 mL via ORAL

## 2012-12-13 MED ORDER — WARFARIN - PHARMACIST DOSING INPATIENT: Status: DC

## 2012-12-13 NOTE — Progress Notes (Signed)
ANTICOAGULATION CONSULT NOTE - Initial Consult  Pharmacy Consult for Coumadin (chronic Rx PTA) Indication: atrial fibrillation  Allergies  Allergen Reactions  . Alprazolam     REACTION: sleepy  . Amlodipine Other (See Comments)    Edema feet & legs, nausea  . Atorvastatin     REACTION: myalgia  . Azithromycin     REACTION: rash  . Cephalexin     REACTION: rash  . Chlorthalidone     Nausea, loss of appetite, insomnia, generally did not feel well  . Crestor [Rosuvastatin]   . Epinephrine     REACTION: B/P,chest pain  . Inderal [Propranolol]   . Norvasc [Amlodipine Besylate]   . Penicillins     REACTION: rash  . Polysporin [Bacitracin-Polymyxin B]   . Premarin [Conjugated Estrogens]     rash  . Propranolol Hcl     REACTION: rash  . Quinapril Hcl     REACTION: rash  . Statins     REACTION: myalgia  . Sulfamethoxazole-Trimethoprim   . Zetia [Ezetimibe]     Patient Measurements: Height: 4\' 11"  (149.9 cm) Weight: 115 lb 15.4 oz (52.6 kg) IBW/kg (Calculated) : 43.2  Vital Signs: Temp: 98 F (36.7 C) (10/19 0500) Temp src: Oral (10/19 0500) BP: 158/58 mmHg (10/19 0500) Pulse Rate: 58 (10/19 0500)  Labs:  Recent Labs  12/11/12 1126 12/12/12 1804 12/12/12 2358 12/13/12 0632  HGB  --  13.5  --  12.5  HCT  --  40.5  --  38.0  PLT  --  260  --  217  LABPROT  --  23.8*  --  23.6*  INR 3.5 2.21*  --  2.18*  CREATININE  --  1.22*  --  0.94  TROPONINI  --  <0.30 <0.30 <0.30    Estimated Creatinine Clearance: 31.9 ml/min (by C-G formula based on Cr of 0.94).  Medical History: Past Medical History  Diagnosis Date  . PAF (paroxysmal atrial fibrillation)   . HTN (hypertension)   . DM2 (diabetes mellitus, type 2)   . MR (mitral regurgitation)     mild  . CAD (coronary artery disease)     nonobstructive  . Dyslipidemia   . Drug intolerance     intolerant to numerous statins and Zetia  . Anxiety disorder     generalized  . Arthritis   . Breast cancer 1988     s/p right lumpectomy  . Colon cancer 1991    s/p right hemicolectom   Medications:  Prescriptions prior to admission  Medication Sig Dispense Refill  . Cholecalciferol (VITAMIN D3) 400 UNITS tablet Take 400 Units by mouth daily.       . cyanocobalamin 500 MCG tablet Take 500 mcg by mouth daily.      . diazepam (VALIUM) 5 MG tablet Take 5 mg by mouth daily as needed. Anxiety.      Marland Kitchen diltiazem (CARDIZEM CD) 180 MG 24 hr capsule Take 1 capsule (180 mg total) by mouth daily.  30 capsule  6  . glipiZIDE (GLUCOTROL XL) 5 MG 24 hr tablet Take 5 mg by mouth daily.        . hydrALAZINE (APRESOLINE) 25 MG tablet Take 1 tablet (25 mg total) by mouth 3 (three) times daily.  90 tablet  6  . nitroGLYCERIN (NITROSTAT) 0.4 MG SL tablet Place 0.4 mg under the tongue every 5 (five) minutes as needed for chest pain.      Marland Kitchen warfarin (COUMADIN) 2.5 MG tablet Take 5 mg by  mouth daily at 6 PM.        Assessment: 79yo female on chronic Coumadin at home for h/o afib.  Home dose listed above.  INR is therapeutic.  Goal of Therapy:  INR 2-3 Monitor platelets by anticoagulation protocol: Yes   Plan:  Continue home dosing regimen:  Coumadin 5mg  po daily INR daily for now Monitor CBC  Margo Aye, [place] Kestner A 12/13/2012,9:51 AM

## 2012-12-13 NOTE — Progress Notes (Signed)
Patient received discharge instructions along with follow up appointments. Patient was not prescribed any new medications. Patient verbalized understanding of all instructions. Patient was escorted by staff via wheelchair to vehicle. Patient discharged to home in stable condition.

## 2012-12-13 NOTE — Discharge Summary (Signed)
Physician Discharge Summary  Judith Chung:[id number] DOB: 06/22/1924 DOA: 12/12/2012  PCP: Selinda Flavin, MD  Admit date: 12/12/2012 Discharge date: 12/13/2012  Time spent: 45 minutes  Recommendations for Outpatient Follow-up:  1. Follow up with cardiologist in the next 1-2 weeks  Discharge Diagnoses:  1. Chest pain, atypical, likely musculoskeletal 2. HTN 3. Diabetes type 2 4. Paroxysmal A fib, on rate control and anticoagulation 5. Anxiety 6. CAD  Discharge Condition: improved  Diet recommendation: low salt, low carb  Filed Weights   12/12/12 2100 12/13/12 0637  Weight: 53.9 kg (118 lb 13.3 oz) 52.6 kg (115 lb 15.4 oz)    History of present illness:  This is a 79 y.o. year old female with past medical history of hypertension, hyperlipidemia, A. fib on Coumadin, not short of coronary artery disease presented today with chest pain. Patient states she noticed progressive onset of left-sided chest pain that radiates to the neck and shoulder earlier today. Pain was fairly constant and occurred at rest. Patient states she took a nitroglycerin and this seemed to improve symptoms. The patient went on throughout her day and had a second episode 4-5 hours later with similar morphology. Patient took another nitroglycerin with improvement in symptoms. A third episode occurred the patient decided to come to the ER. Per patient, she had a similar episode of chest pain that occurred 2-4 weeks ago. She was admitted to [facility] where she had a normal Myoview at that time. Patient is followed by [facility] cardiology in [place]. Patient states the pain is a similar distribution/morphology. Patient denies any recent changes activity. No heavy lifting. No shortness of breath. No diaphoresis or nausea. Chest pain is 4/10 and is worse.  In the ER, patient had a EKG that showed normal sinus rhythm. First set of troponins within normal limits. Chest x-ray without any focal  abnormalities.   Hospital Course:  This patient was admitted to the hospital with atypical chest pain. She does have a history of coronary artery disease. Case was discussed with cardiology on call, Dr. Katha Cabal and it was felt that patient would be appropriate to be admitted to [facility] for chest pain rule out. Her pain was very atypical, was reproducible on palpation of her left chest. Her left shoulder pain is also reproducible with movement of her shoulder. Her EKG was nonacute, cardiac enzymes were found to be negative. The patient did have a negative stress test 8/14. Patient was recommended to stay on aspirin daily and follow up with her cardiologist in the next one to 2 weeks. Currently her symptoms have resolved. She will be discharged home.   Procedures:  none  Consultations:  none  Discharge Exam: Filed Vitals:   12/13/12 1251  BP: 185/63  Pulse: 63  Temp: 97.7 F (36.5 C)  Resp: 18    General: NAD Cardiovascular: S1, s2 RRR Respiratory: CTA B MSK: tenderness to palpation over left chest  Discharge Instructions  Discharge Orders   Future Appointments Provider Department Dept Phone   12/25/2012 11:25 AM [facility] [phone]   Future Orders Complete By Expires   Call MD for:  difficulty breathing, headache or visual disturbances  As directed    Call MD for:  persistant nausea and vomiting  As directed    Call MD for:  severe uncontrolled pain  As directed    Diet - low sodium heart healthy  As directed    Diet Carb Modified  As  directed    Increase activity slowly  As directed        Medication List         aspirin EC 81 MG tablet  Take 1 tablet (81 mg total) by mouth daily.     cyanocobalamin 500 MCG tablet  Take 500 mcg by mouth daily.     diazepam 5 MG tablet  Commonly known as:  VALIUM  Take 5 mg by mouth daily as needed. Anxiety.     diltiazem 180 MG 24 hr capsule  Commonly known  as:  CARDIZEM CD  Take 1 capsule (180 mg total) by mouth daily.     glipiZIDE 5 MG 24 hr tablet  Commonly known as:  GLUCOTROL XL  Take 5 mg by mouth daily.     hydrALAZINE 25 MG tablet  Commonly known as:  APRESOLINE  Take 1 tablet (25 mg total) by mouth 3 (three) times daily.     nitroGLYCERIN 0.4 MG SL tablet  Commonly known as:  NITROSTAT  Place 0.4 mg under the tongue every 5 (five) minutes as needed for chest pain.     Vitamin D3 400 UNITS tablet  Take 400 Units by mouth daily.     warfarin 2.5 MG tablet  Commonly known as:  COUMADIN  Take 5 mg by mouth daily at 6 PM.       Allergies  Allergen Reactions  . Alprazolam     REACTION: sleepy  . Amlodipine Other (See Comments)    Edema feet & legs, nausea  . Atorvastatin     REACTION: myalgia  . Azithromycin     REACTION: rash  . Cephalexin     REACTION: rash  . Chlorthalidone     Nausea, loss of appetite, insomnia, generally did not feel well  . Crestor [Rosuvastatin]   . Epinephrine     REACTION: B/P,chest pain  . Inderal [Propranolol]   . Norvasc [Amlodipine Besylate]   . Penicillins     REACTION: rash  . Polysporin [Bacitracin-Polymyxin B]   . Premarin [Conjugated Estrogens]     rash  . Propranolol Hcl     REACTION: rash  . Quinapril Hcl     REACTION: rash  . Statins     REACTION: myalgia  . Sulfamethoxazole-Trimethoprim   . Zetia [Ezetimibe]        Follow-up Information   Follow up with Prentice Docker A, MD. Schedule an appointment as soon as possible for a visit in 1 week.   Specialty:  Cardiology   Contact information:   [address] [place] [address] [phone]        The results of significant diagnostics from this hospitalization (including imaging, microbiology, ancillary and laboratory) are listed below for reference.    Significant Diagnostic Studies: Dg Chest 2 View  12/12/2012   CLINICAL DATA:  Chest pain, shortness of Breath.  EXAM: CHEST  2 VIEW  COMPARISON:   10/12/2012  FINDINGS: The heart size and mediastinal contours are within normal limits. Both lungs are clear. The visualized skeletal structures are unremarkable.  IMPRESSION: No active cardiopulmonary disease.   Electronically Signed   By: Charlett Nose M.D.   On: 12/12/2012 18:53    Microbiology: No results found for this or any previous visit (from the past 240 hour(s)).   Labs: Basic Metabolic Panel:  Recent Labs Lab 12/12/12 1804 12/13/12 0632  NA 137 142  K 3.9 3.4*  CL 99 106  CO2 25 26  GLUCOSE 90 68*  BUN 27* 20  CREATININE 1.22* 0.94  CALCIUM 10.0 8.7   Liver Function Tests:  Recent Labs Lab 12/13/12 0632  AST 17  ALT 12  ALKPHOS 46  BILITOT 0.4  PROT 6.1  ALBUMIN 3.3*   No results found for this basename: LIPASE, AMYLASE,  in the last 168 hours No results found for this basename: AMMONIA,  in the last 168 hours CBC:  Recent Labs Lab 12/12/12 1804 12/13/12 0632  WBC 9.1 7.2  NEUTROABS 6.4  --   HGB 13.5 12.5  HCT 40.5 38.0  MCV 88.2 89.0  PLT 260 217   Cardiac Enzymes:  Recent Labs Lab 12/12/12 1804 12/12/12 2358 12/13/12 0632 12/13/12 1207  TROPONINI <0.30 <0.30 <0.30 <0.30   BNP: BNP (last 3 results) No results found for this basename: PROBNP,  in the last 8760 hours CBG:  Recent Labs Lab 12/12/12 2004 12/12/12 2046 12/13/12 0730 12/13/12 1218  GLUCAP 67* 117* 77 107*       Signed:  Kammie Scioli  [facility] 12/13/2012, 3:11 PM

## 2012-12-22 ENCOUNTER — Encounter: Payer: Self-pay | Admitting: Cardiovascular Disease

## 2012-12-22 ENCOUNTER — Ambulatory Visit (INDEPENDENT_AMBULATORY_CARE_PROVIDER_SITE_OTHER): Payer: Medicare Other | Admitting: Cardiovascular Disease

## 2012-12-22 VITALS — BP 193/73 | HR 72 | Ht 59.0 in | Wt 117.0 lb

## 2012-12-22 DIAGNOSIS — I10 Essential (primary) hypertension: Secondary | ICD-10-CM

## 2012-12-22 DIAGNOSIS — Z7901 Long term (current) use of anticoagulants: Secondary | ICD-10-CM

## 2012-12-22 DIAGNOSIS — R351 Nocturia: Secondary | ICD-10-CM

## 2012-12-22 DIAGNOSIS — I4891 Unspecified atrial fibrillation: Secondary | ICD-10-CM

## 2012-12-22 DIAGNOSIS — R079 Chest pain, unspecified: Secondary | ICD-10-CM

## 2012-12-22 NOTE — Patient Instructions (Signed)
Your physician recommends that you schedule a follow-up appointment in: TO BE DETERMINED PENDING RESULTS OF YOUR MONITOR   .Marland KitchenYour physician recommends THAT YOU WEAR A BLOOD PRESSURE MONITOR WE WILL CALL YOU WITH YOUR TEST RESULTS/INSTRUCTIONS/NEXT STEPS ONCE RECEIVED BY THE PROVIDER

## 2012-12-22 NOTE — Progress Notes (Signed)
Patient ID: Beatrice Lecher, female   DOB: June 11, 1924, 79 y.o.   MRN: [id number]      SUBJECTIVE: Mrs. Poster is an 79 year old female with a history of paroxysmal atrial fibrillation and atrial flutter. She is on chronic Coumadin therapy, and denies bleeding problems. She has a history of normal LV function and nonobstructive coronary disease by cardiac catheterization 2005. She then had a followup adenosine Cardiolite study with an ejection fraction 64% in March of 2009 with no ischemia.   She was then admitted to [facility] in August with chest pain and hypokalemia. She underwent a Lexiscan Cardiolite stress test which showed no evidence of ischemia or scar and was a low-risk study, which I interpreted.  I recently started her on amlodipine for HTN. She did not tolerate this so it was switched to chlorthalidone, which she also didn't tolerate. I subsequently started her on hydralazine.  She was recently hospitalized again, this time at [facility], for chest pain. She ruled out for an ACS with normal troponins and her ECG was unremarkable. Her pain was reproducible with palpation and rotation of her left shoulder joint.  She has not been feeling well and has been urinating several times a night, in spite of being off a diuretic. Because of this, she has not been sleeping well. She currently denies chest pain and hasn't had any for the past week.  However, she now has bilateral arm fatigue and feels "lousy" overall, and she thinks it's due to not sleeping. She's been checking her BP at home and it runs from 128-150's/70-80's predominantly, with a heart rates between 60-80 bpm.  Today it is 193/73 mmHg, and a repeat check was 186/76 mmHg.    Allergies  Allergen Reactions  . Alprazolam     REACTION: sleepy  . Amlodipine Other (See Comments)    Edema feet & legs, nausea  . Atorvastatin     REACTION: myalgia  . Azithromycin     REACTION: rash  . Cephalexin     REACTION: rash  . Chlorthalidone     Nausea, loss of appetite, insomnia, generally did not feel well  . Crestor [Rosuvastatin]   . Epinephrine     REACTION: B/P,chest pain  . Inderal [Propranolol]   . Norvasc [Amlodipine Besylate]   . Penicillins     REACTION: rash  . Polysporin [Bacitracin-Polymyxin B]   . Premarin [Conjugated Estrogens]     rash  . Propranolol Hcl     REACTION: rash  . Quinapril Hcl     REACTION: rash  . Statins     REACTION: myalgia  . Sulfamethoxazole-Trimethoprim   . Zetia [Ezetimibe]     Current Outpatient Prescriptions  Medication Sig Dispense Refill  . aspirin EC 81 MG tablet Take 1 tablet (81 mg total) by mouth daily.  30 tablet  1  . Cholecalciferol (VITAMIN D3) 400 UNITS tablet Take 400 Units by mouth daily.       . cyanocobalamin 500 MCG tablet Take 500 mcg by mouth daily.      . diazepam (VALIUM) 5 MG tablet Take 5 mg by mouth daily as needed. Anxiety.      Marland Kitchen diltiazem (CARDIZEM CD) 180 MG 24 hr capsule Take 1 capsule (180 mg total) by mouth daily.  30 capsule  6  . glipiZIDE (GLUCOTROL XL) 5 MG 24 hr tablet Take 5 mg by mouth daily.        . hydrALAZINE (APRESOLINE) 25 MG tablet Take 1 tablet (25 mg total) by  mouth 3 (three) times daily.  90 tablet  6  . nitroGLYCERIN (NITROSTAT) 0.4 MG SL tablet Place 0.4 mg under the tongue every 5 (five) minutes as needed for chest pain.      Marland Kitchen warfarin (COUMADIN) 2.5 MG tablet Take 5 mg by mouth daily at 6 PM.       No current facility-administered medications for this visit.    Past Medical History  Diagnosis Date  . PAF (paroxysmal atrial fibrillation)   . HTN (hypertension)   . DM2 (diabetes mellitus, type 2)   . MR (mitral regurgitation)     mild  . CAD (coronary artery disease)     nonobstructive  . Dyslipidemia   . Drug intolerance     intolerant to numerous statins and Zetia  . Anxiety disorder     generalized  . Arthritis   . Breast cancer 1988    s/p right lumpectomy  . Colon cancer 1991    s/p right hemicolectom     Past Surgical History  Procedure Laterality Date  . Chronic coumadin      CHAD2:3 (medical hx)  . Colon surgery  1991    rt  . Laproscopic cholecystectomy    . Tonsillectomy    . Cataract extraction w/ intraocular lens  implant, bilateral      no cataract extraction   . Breast lumpectomy  1988    rt  . Appendectomy    . Tonsillectomy    . Cholecystectomy    . Cardiac catheterization  2005    [place]: nonobstructive coronary disease, normal LV function    History   Social History  . Marital Status: Married    Spouse Name: N/A    Number of Children: N/A  . Years of Education: N/A   Occupational History  . Not on file.   Social History Main Topics  . Smoking status: Never Smoker   . Smokeless tobacco: Never Used     Comment: tobacco use - no  . Alcohol Use: No  . Drug Use: No  . Sexual Activity: Not on file   Other Topics Concern  . Not on file   Social History Narrative  . No narrative on file     Filed Vitals:   12/22/12 0901  BP: 193/73  Pulse: 72  Height: 4\' 11"  (1.499 m)  Weight: 117 lb (53.071 kg)    PHYSICAL EXAM General: NAD Neck: No JVD, no thyromegaly or thyroid nodule.  Lungs: Clear to auscultation bilaterally with normal respiratory effort. CV: Nondisplaced PMI.  Heart regular S1/S2, no S3/S4, no murmur.  No peripheral edema.  No carotid bruit.  Normal pedal pulses.  Abdomen: Soft, nontender, no hepatosplenomegaly, no distention.  Neurologic: Alert and oriented x 3.  Psych: Normal affect. Extremities: No clubbing or cyanosis.   ECG: reviewed and available in electronic records.      ASSESSMENT AND PLAN:  1. Atrial fibrillation: currently in a regular rhythm and tolerating warfarin. No changes in therapy.  2. HTN: uncontrolled in office, but she has brought in her home readings which have been reasonable. I will order an ambulatory BP monitor before making further med adjustments. However, I would consider stopping hydralazine as  she may be experiencing yet another adverse reaction to a medication, and starting Tekturna 150 mg daily. 3. Chest pain: resolved. May have been musculoskeletal in etiology, given reproducibility with palpation and movement of left shoulder joint. She had a normal myocardial perfusion study on 10/13/12. If her symptoms  recur, I may consider coronary angiography.    Prentice Docker, M.D., F.A.C.C.

## 2012-12-24 ENCOUNTER — Encounter (HOSPITAL_COMMUNITY): Payer: PRIVATE HEALTH INSURANCE

## 2012-12-24 DIAGNOSIS — I4891 Unspecified atrial fibrillation: Secondary | ICD-10-CM | POA: Diagnosis not present

## 2012-12-24 DIAGNOSIS — R351 Nocturia: Secondary | ICD-10-CM | POA: Diagnosis not present

## 2012-12-24 DIAGNOSIS — E7800 Pure hypercholesterolemia, unspecified: Secondary | ICD-10-CM | POA: Diagnosis not present

## 2012-12-24 DIAGNOSIS — E119 Type 2 diabetes mellitus without complications: Secondary | ICD-10-CM | POA: Diagnosis not present

## 2012-12-24 DIAGNOSIS — M129 Arthropathy, unspecified: Secondary | ICD-10-CM | POA: Diagnosis not present

## 2012-12-25 ENCOUNTER — Ambulatory Visit (INDEPENDENT_AMBULATORY_CARE_PROVIDER_SITE_OTHER): Payer: Medicare Other | Admitting: *Deleted

## 2012-12-25 DIAGNOSIS — Z8679 Personal history of other diseases of the circulatory system: Secondary | ICD-10-CM | POA: Diagnosis not present

## 2012-12-25 DIAGNOSIS — Z7901 Long term (current) use of anticoagulants: Secondary | ICD-10-CM | POA: Diagnosis not present

## 2012-12-28 ENCOUNTER — Ambulatory Visit (HOSPITAL_COMMUNITY)
Admission: RE | Admit: 2012-12-28 | Discharge: 2012-12-28 | Disposition: A | Discharge status: Home / Self Care | Payer: Medicare Other | Source: Ambulatory Visit | Attending: Cardiovascular Disease | Admitting: Cardiovascular Disease

## 2012-12-28 DIAGNOSIS — I4891 Unspecified atrial fibrillation: Secondary | ICD-10-CM | POA: Insufficient documentation

## 2012-12-28 DIAGNOSIS — Z79899 Other long term (current) drug therapy: Secondary | ICD-10-CM | POA: Insufficient documentation

## 2012-12-28 DIAGNOSIS — I10 Essential (primary) hypertension: Secondary | ICD-10-CM | POA: Insufficient documentation

## 2012-12-28 DIAGNOSIS — Z7901 Long term (current) use of anticoagulants: Secondary | ICD-10-CM | POA: Insufficient documentation

## 2012-12-28 NOTE — Progress Notes (Signed)
Ambulatory Blood Pressure Monitor in progress.

## 2013-01-12 ENCOUNTER — Telehealth: Payer: Self-pay | Admitting: *Deleted

## 2013-01-12 NOTE — Telephone Encounter (Signed)
Message      I reviewed her ambulatory BP monitor, and her avg reading is 178/82 mmHg, consistent with stage II hypertension. Please call her to see how she's doing with respect to being on hydralazine. If she's continued to feel poorly, would d/c that and start Tekturna 150 mg daily. Her home BP monitor does not accurately reflect what her BP is actually doing.  - per Dr. Purvis Sheffield.        Patient Information      Patient Name Sex DOB SSN      Judith, Chung Female 05-24-1924 [id number]               Progress Notes by Tawni Millers, RN at 12/28/2012  2:02 PM      Author: Tawni Millers, RN Service: (none) Author Type: Registered Nurse      Filed: 12/28/2012  2:04 PM Note Time: 12/28/2012  2:02 PM Status: Signed      Editor: Tawni Millers, RN (Registered Nurse)          Ambulatory Blood Pressure Monitor in progress.

## 2013-01-12 NOTE — Telephone Encounter (Signed)
Switch hydralazine to Tekturna 150 mg daily, and have her monitor her BP over next several weeks and call to inform us.

## 2013-01-12 NOTE — Telephone Encounter (Signed)
Patient notified.  States she is feeling pretty good right now, only problem is going to bathroom a lot during middle of night.  Please advise if you still want to make medication changes.

## 2013-01-14 NOTE — Telephone Encounter (Signed)
Patient notified.  Call placed to [facility].  Her insurance will not cover this medication without a prior authorization.  She has to have tried and failed either Losartan (Cozaar) or Irbesartan (Avapro).  She will continue her Hydralazine in the meantime.  Info sent to MD for advice.

## 2013-01-14 NOTE — Telephone Encounter (Signed)
Left message to return call 

## 2013-01-15 ENCOUNTER — Ambulatory Visit (INDEPENDENT_AMBULATORY_CARE_PROVIDER_SITE_OTHER): Payer: Medicare Other | Admitting: *Deleted

## 2013-01-15 DIAGNOSIS — Z8679 Personal history of other diseases of the circulatory system: Secondary | ICD-10-CM

## 2013-01-15 DIAGNOSIS — Z7901 Long term (current) use of anticoagulants: Secondary | ICD-10-CM

## 2013-01-15 NOTE — Telephone Encounter (Signed)
Defer to [facility] for this, this can be handled on Monday  Dina Rich MD

## 2013-01-18 MED ORDER — LOSARTAN POTASSIUM 50 MG PO TABS: 50.0000 mg | ORAL_TABLET | Freq: Every day | ORAL | Status: DC

## 2013-01-18 NOTE — Telephone Encounter (Signed)
Patient notified.  New rx sent to [facility].

## 2013-01-18 NOTE — Telephone Encounter (Signed)
If she's not had an adverse reaction to Cozaar, this can be started at 50 mg daily and hydralazine can be d/c, so as to avoid her increased nocturia.

## 2013-02-03 ENCOUNTER — Telehealth: Payer: Self-pay | Admitting: *Deleted

## 2013-02-03 NOTE — Telephone Encounter (Signed)
Judith Chung (husband) in office to see Dr. Purvis Sheffield on Tuesday.  Discussed wife's BP readings with MD.

## 2013-02-03 NOTE — Telephone Encounter (Signed)
Message copied by Lesle Chris on Wed Feb 03, 2013  2:37 PM ------      Message from: Prentice Docker A      Created: Tue Feb 02, 2013 10:22 AM       Cozaar can be increased to 50 mg q am and 25 mg q pm.      Her husband said that her SBP has been running 150-160 mmHg, but she is tolerating the med well. ------

## 2013-02-03 NOTE — Telephone Encounter (Signed)
PATIENT RETURNED YOUR CALL

## 2013-02-03 NOTE — Telephone Encounter (Signed)
Left message to return call 

## 2013-02-04 NOTE — Telephone Encounter (Signed)
Patient called again returning your call.

## 2013-02-05 MED ORDER — LOSARTAN POTASSIUM 50 MG PO TABS: ORAL_TABLET | ORAL | Status: DC

## 2013-02-05 NOTE — Telephone Encounter (Signed)
Notified patient.

## 2013-02-09 ENCOUNTER — Other Ambulatory Visit: Payer: Self-pay | Admitting: *Deleted

## 2013-02-09 MED ORDER — LOSARTAN POTASSIUM 50 MG PO TABS: ORAL_TABLET | ORAL | Status: DC

## 2013-02-12 ENCOUNTER — Telehealth: Payer: Self-pay | Admitting: *Deleted

## 2013-02-12 ENCOUNTER — Ambulatory Visit (INDEPENDENT_AMBULATORY_CARE_PROVIDER_SITE_OTHER): Payer: Medicare Other | Admitting: Pharmacist

## 2013-02-12 DIAGNOSIS — Z8679 Personal history of other diseases of the circulatory system: Secondary | ICD-10-CM

## 2013-02-12 DIAGNOSIS — Z7901 Long term (current) use of anticoagulants: Secondary | ICD-10-CM

## 2013-02-12 NOTE — Telephone Encounter (Signed)
Patient in office today for coumadin check.  States she is doing good with recent medication changes.  BP looking good.

## 2013-02-26 ENCOUNTER — Other Ambulatory Visit: Payer: Self-pay | Admitting: *Deleted

## 2013-02-26 MED ORDER — WARFARIN SODIUM 2.5 MG PO TABS: 5.0000 mg | ORAL_TABLET | Freq: Every day | ORAL | Status: DC

## 2013-03-04 DIAGNOSIS — Z961 Presence of intraocular lens: Secondary | ICD-10-CM | POA: Diagnosis not present

## 2013-03-04 DIAGNOSIS — H538 Other visual disturbances: Secondary | ICD-10-CM | POA: Diagnosis not present

## 2013-03-04 DIAGNOSIS — E119 Type 2 diabetes mellitus without complications: Secondary | ICD-10-CM | POA: Diagnosis not present

## 2013-03-08 ENCOUNTER — Telehealth: Payer: Self-pay | Admitting: *Deleted

## 2013-03-08 NOTE — Telephone Encounter (Signed)
Patient called with question regarding her low blood sugar.  Had episode recently where she woke up in middle of night, checked sugar - 60.  Stated she felt things go numb and fell out.  Stated her husband helped to get her up & she drank some orange juice.  Stated she felt much better after that.  Advised her to contact her PMD to inform them of this as Dr. Nadara Mustard has been managing her diabetes for her.  Patient verbalized understanding.

## 2013-03-12 ENCOUNTER — Ambulatory Visit (INDEPENDENT_AMBULATORY_CARE_PROVIDER_SITE_OTHER): Payer: Medicare Other | Admitting: *Deleted

## 2013-03-12 DIAGNOSIS — Z8679 Personal history of other diseases of the circulatory system: Secondary | ICD-10-CM | POA: Diagnosis not present

## 2013-03-12 DIAGNOSIS — Z7901 Long term (current) use of anticoagulants: Secondary | ICD-10-CM

## 2013-03-12 LAB — POCT INR: INR: 2.4

## 2013-03-16 DIAGNOSIS — E162 Hypoglycemia, unspecified: Secondary | ICD-10-CM | POA: Diagnosis not present

## 2013-03-16 DIAGNOSIS — I4891 Unspecified atrial fibrillation: Secondary | ICD-10-CM | POA: Diagnosis not present

## 2013-03-24 DIAGNOSIS — I4891 Unspecified atrial fibrillation: Secondary | ICD-10-CM | POA: Diagnosis not present

## 2013-03-24 DIAGNOSIS — R634 Abnormal weight loss: Secondary | ICD-10-CM | POA: Diagnosis not present

## 2013-03-24 DIAGNOSIS — I8390 Asymptomatic varicose veins of unspecified lower extremity: Secondary | ICD-10-CM | POA: Diagnosis not present

## 2013-03-24 DIAGNOSIS — M129 Arthropathy, unspecified: Secondary | ICD-10-CM | POA: Diagnosis not present

## 2013-03-24 DIAGNOSIS — I10 Essential (primary) hypertension: Secondary | ICD-10-CM | POA: Diagnosis not present

## 2013-03-24 DIAGNOSIS — N3090 Cystitis, unspecified without hematuria: Secondary | ICD-10-CM | POA: Diagnosis not present

## 2013-03-29 DIAGNOSIS — D692 Other nonthrombocytopenic purpura: Secondary | ICD-10-CM | POA: Diagnosis not present

## 2013-03-29 DIAGNOSIS — L821 Other seborrheic keratosis: Secondary | ICD-10-CM | POA: Diagnosis not present

## 2013-03-29 DIAGNOSIS — L299 Pruritus, unspecified: Secondary | ICD-10-CM | POA: Diagnosis not present

## 2013-04-07 DIAGNOSIS — E119 Type 2 diabetes mellitus without complications: Secondary | ICD-10-CM | POA: Diagnosis not present

## 2013-04-07 DIAGNOSIS — R634 Abnormal weight loss: Secondary | ICD-10-CM | POA: Diagnosis not present

## 2013-04-07 DIAGNOSIS — M129 Arthropathy, unspecified: Secondary | ICD-10-CM | POA: Diagnosis not present

## 2013-04-09 ENCOUNTER — Telehealth: Payer: Self-pay | Admitting: *Deleted

## 2013-04-09 ENCOUNTER — Ambulatory Visit (INDEPENDENT_AMBULATORY_CARE_PROVIDER_SITE_OTHER): Payer: Medicare Other | Admitting: *Deleted

## 2013-04-09 DIAGNOSIS — Z8679 Personal history of other diseases of the circulatory system: Secondary | ICD-10-CM

## 2013-04-09 DIAGNOSIS — Z5181 Encounter for therapeutic drug level monitoring: Secondary | ICD-10-CM | POA: Diagnosis not present

## 2013-04-09 DIAGNOSIS — Z7901 Long term (current) use of anticoagulants: Secondary | ICD-10-CM | POA: Diagnosis not present

## 2013-04-09 LAB — POCT INR: INR: 3.4

## 2013-04-09 NOTE — Telephone Encounter (Signed)
Patient in office today for INR visit and had questions about taking 2 different BP medications. Nurse explained to patient that she had one medication that helped keep her heart rate regulated and the other was primarily for her BP. Patient felt like the nausea she would be having at night sometime was coming from the additional 25 mg tablet of losartan. Patient informed nurse that she has problems with constipation a lot and is taking MOM for it but it isn't helping. Nurse advised patient that it was safe to use OTC stool softeners that she could start with first and it that didn't help she could try OTC miralax/glycolax powder. Nurse advised patient that the nausea was probably coming from the constipation instead of the losartan. Patient said she was still monitoring her BP at home and the readings have been good. Patient is scheduled for appointment with Dr Raliegh Ip. Next week and will bring readings with her when she comes in. Names of otc medications for constipation were written down and given to patient.

## 2013-04-12 ENCOUNTER — Telehealth: Payer: Self-pay | Admitting: Cardiology

## 2013-04-12 NOTE — Telephone Encounter (Signed)
Judith Chung called stating that she has questions about the stool softner that she was put on.

## 2013-04-12 NOTE — Telephone Encounter (Signed)
Patient called to ask questions about taking her stool softner. Patient wanted to know if its okay to take this twice a week. Nurse advised patient that this would be fine and she can adjust it according to her stools. Patient verbalized understanding of plan.

## 2013-04-30 ENCOUNTER — Telehealth: Payer: Self-pay | Admitting: Cardiovascular Disease

## 2013-05-03 DIAGNOSIS — K5909 Other constipation: Secondary | ICD-10-CM | POA: Diagnosis not present

## 2013-05-03 DIAGNOSIS — R350 Frequency of micturition: Secondary | ICD-10-CM | POA: Diagnosis not present

## 2013-05-03 NOTE — Telephone Encounter (Signed)
Patient also left message on voice mail that she is getting up every 2 hours at night to urinate & has been going on about 3 weeks now.  Returned call to patient this morning - discussed symptoms with patient.  Informed her that she should be evaluated by PMD since this has been going on so long.  Advised to continue all medications for now as she needs that evening pill for her BP.

## 2013-05-10 DIAGNOSIS — R351 Nocturia: Secondary | ICD-10-CM | POA: Diagnosis not present

## 2013-05-10 DIAGNOSIS — R634 Abnormal weight loss: Secondary | ICD-10-CM | POA: Diagnosis not present

## 2013-05-10 DIAGNOSIS — R109 Unspecified abdominal pain: Secondary | ICD-10-CM | POA: Diagnosis not present

## 2013-05-10 DIAGNOSIS — R350 Frequency of micturition: Secondary | ICD-10-CM | POA: Diagnosis not present

## 2013-05-11 ENCOUNTER — Encounter: Payer: Self-pay | Admitting: Cardiovascular Disease

## 2013-05-11 ENCOUNTER — Ambulatory Visit (INDEPENDENT_AMBULATORY_CARE_PROVIDER_SITE_OTHER): Payer: Medicare Other | Admitting: Cardiovascular Disease

## 2013-05-11 ENCOUNTER — Ambulatory Visit (INDEPENDENT_AMBULATORY_CARE_PROVIDER_SITE_OTHER): Payer: Medicare Other | Admitting: *Deleted

## 2013-05-11 VITALS — BP 152/78 | HR 61 | Ht 60.0 in | Wt 116.0 lb

## 2013-05-11 DIAGNOSIS — Z79899 Other long term (current) drug therapy: Secondary | ICD-10-CM | POA: Diagnosis not present

## 2013-05-11 DIAGNOSIS — Z7901 Long term (current) use of anticoagulants: Secondary | ICD-10-CM | POA: Diagnosis not present

## 2013-05-11 DIAGNOSIS — Z8679 Personal history of other diseases of the circulatory system: Secondary | ICD-10-CM

## 2013-05-11 DIAGNOSIS — I10 Essential (primary) hypertension: Secondary | ICD-10-CM

## 2013-05-11 DIAGNOSIS — Z5181 Encounter for therapeutic drug level monitoring: Secondary | ICD-10-CM

## 2013-05-11 DIAGNOSIS — R109 Unspecified abdominal pain: Secondary | ICD-10-CM

## 2013-05-11 DIAGNOSIS — I4891 Unspecified atrial fibrillation: Secondary | ICD-10-CM

## 2013-05-11 LAB — POCT INR: INR: 3.5

## 2013-05-11 NOTE — Patient Instructions (Signed)
Continue all current medications. Your physician wants you to follow up in: 6 months.  You will receive a reminder letter in the mail one-two months in advance.  If you don't receive a letter, please call our office to schedule the follow up appointment   

## 2013-05-11 NOTE — Progress Notes (Signed)
Patient ID: Judith Chung, female   DOB: 1924-06-23, 80 y.o.   MRN: [id number]      SUBJECTIVE: Judith Chung is an 80 year old female with a history of paroxysmal atrial fibrillation and atrial flutter. She is on chronic Coumadin therapy, and denies bleeding problems. She has a history of normal LV function and nonobstructive coronary disease by cardiac catheterization 2005.   She was admitted to [facility] in August 2014 with chest pain and hypokalemia. She underwent a Lexiscan Cardiolite stress test which showed no evidence of ischemia or scar and was a low-risk study, which I interpreted.  I previously tried her on amlodipine, chlorthalidone, and hydralzine for HTN, but she did not tolerate these medications. I tried Judith Chung but it was too expensive for her. I subsequently started her on losartan. She has had chronic problems with nocturia and constipation. At present, she denies chest pain, dizziness, leg swelling, palpitations, and shortness of breath.  She has had some unexplained weight loss, and her diet has remained stable. She has had lower abdominal discomfort and is scheduled to have an x-ray this Thursday. She denies fevers, chills, nausea, vomiting, hematochezia, melena, and diarrhea.      Allergies  Allergen Reactions  . Alprazolam     REACTION: sleepy  . Amlodipine Other (See Comments)    Edema feet & legs, nausea  . Atorvastatin     REACTION: myalgia  . Azithromycin     REACTION: rash  . Cephalexin     REACTION: rash  . Chlorthalidone     Nausea, loss of appetite, insomnia, generally did not feel well  . Crestor [Rosuvastatin]   . Epinephrine     REACTION: B/P,chest pain  . Inderal [Propranolol]   . Norvasc [Amlodipine Besylate]   . Penicillins     REACTION: rash  . Polysporin [Bacitracin-Polymyxin B]   . Premarin [Conjugated Estrogens]     rash  . Propranolol Hcl     REACTION: rash  . Quinapril Hcl     REACTION: rash  . Statins     REACTION: myalgia  .  Sulfamethoxazole-Trimethoprim   . Zetia [Ezetimibe]     Current Outpatient Prescriptions  Medication Sig Dispense Refill  . Cholecalciferol (VITAMIN D3) 400 UNITS tablet Take 400 Units by mouth daily.       . cyanocobalamin 500 MCG tablet Take 500 mcg by mouth daily.      . diazepam (VALIUM) 5 MG tablet Take 5 mg by mouth daily as needed. Anxiety.      Marland Kitchen diltiazem (CARDIZEM CD) 180 MG 24 hr capsule Take 1 capsule (180 mg total) by mouth daily.  30 capsule  6  . losartan (COZAAR) 50 MG tablet Take 50mg  every morning & 25mg  every evening  45 tablet  6  . nitroGLYCERIN (NITROSTAT) 0.4 MG SL tablet Place 0.4 mg under the tongue every 5 (five) minutes as needed for chest pain.      Marland Kitchen warfarin (COUMADIN) 2.5 MG tablet Take 2 tablets (5 mg total) by mouth daily at 6 PM.  60 tablet  3   No current facility-administered medications for this visit.    Past Medical History  Diagnosis Date  . PAF (paroxysmal atrial fibrillation)   . HTN (hypertension)   . DM2 (diabetes mellitus, type 2)   . MR (mitral regurgitation)     mild  . CAD (coronary artery disease)     nonobstructive  . Dyslipidemia   . Drug intolerance     intolerant to numerous  statins and Zetia  . Anxiety disorder     generalized  . Arthritis   . Breast cancer 1988    s/p right lumpectomy  . Colon cancer 1991    s/p right hemicolectom    Past Surgical History  Procedure Laterality Date  . Chronic coumadin      CHAD2:3 (medical hx)  . Colon surgery  1991    rt  . Laproscopic cholecystectomy    . Tonsillectomy    . Cataract extraction w/ intraocular lens  implant, bilateral      no cataract extraction   . Breast lumpectomy  1988    rt  . Appendectomy    . Tonsillectomy    . Cholecystectomy    . Cardiac catheterization  2005    [place]: nonobstructive coronary disease, normal LV function    History   Social History  . Marital Status: Married    Spouse Name: N/A    Number of Children: N/A  . Years of  Education: N/A   Occupational History  . Not on file.   Social History Main Topics  . Smoking status: Never Smoker   . Smokeless tobacco: Never Used     Comment: tobacco use - no  . Alcohol Use: No  . Drug Use: No  . Sexual Activity: Not on file   Other Topics Concern  . Not on file   Social History Narrative  . No narrative on file     Filed Vitals:   05/11/13 1043  BP: 152/78  Pulse: 61  Height: 5' (1.524 m)  Weight: 116 lb (52.617 kg)  SpO2: 97%    PHYSICAL EXAM General: NAD Neck: No JVD, no thyromegaly. Lungs: Clear to auscultation bilaterally with normal respiratory effort. CV: Nondisplaced PMI.  Regular rate and rhythm, normal S1/S2, no S3/S4, no murmur. No pretibial or periankle edema.  No carotid bruit.  Normal pedal pulses.  Abdomen: Soft, nontender, no hepatosplenomegaly, no distention.  Neurologic: Alert and oriented x 3.  Psych: Normal affect. Extremities: No clubbing or cyanosis.   ECG: reviewed and available in electronic records.      ASSESSMENT AND PLAN: 1. Atrial fibrillation: currently in a regular rhythm and tolerating warfarin. No changes in therapy. Due to have INR checked today. 2. HTN: well controlled on losartan. Intolerant to several antihypertensives in the past. 3. Chest pain: resolved. May have been musculoskeletal in etiology, given reproducibility with palpation and movement of left shoulder joint. She had a normal myocardial perfusion study on 10/13/12. If her symptoms recur, I may consider coronary angiography. 4. Abdominal pain: scheduled to have xray this Thursday, with management per Dr. Nadara Mustard (PCP).  Dispo: f/u 6 months.  Kate Sable, M.D., F.A.C.C.

## 2013-05-13 DIAGNOSIS — Z9089 Acquired absence of other organs: Secondary | ICD-10-CM | POA: Diagnosis not present

## 2013-05-13 DIAGNOSIS — K5730 Diverticulosis of large intestine without perforation or abscess without bleeding: Secondary | ICD-10-CM | POA: Diagnosis not present

## 2013-05-13 DIAGNOSIS — R109 Unspecified abdominal pain: Secondary | ICD-10-CM | POA: Diagnosis not present

## 2013-05-13 DIAGNOSIS — R634 Abnormal weight loss: Secondary | ICD-10-CM | POA: Diagnosis not present

## 2013-06-01 ENCOUNTER — Ambulatory Visit (INDEPENDENT_AMBULATORY_CARE_PROVIDER_SITE_OTHER): Payer: Medicare Other | Admitting: *Deleted

## 2013-06-01 DIAGNOSIS — Z8679 Personal history of other diseases of the circulatory system: Secondary | ICD-10-CM | POA: Diagnosis not present

## 2013-06-01 DIAGNOSIS — Z5181 Encounter for therapeutic drug level monitoring: Secondary | ICD-10-CM | POA: Diagnosis not present

## 2013-06-01 DIAGNOSIS — Z7901 Long term (current) use of anticoagulants: Secondary | ICD-10-CM

## 2013-06-01 LAB — POCT INR: INR: 2.6

## 2013-06-13 DIAGNOSIS — Z85038 Personal history of other malignant neoplasm of large intestine: Secondary | ICD-10-CM | POA: Diagnosis not present

## 2013-06-13 DIAGNOSIS — N390 Urinary tract infection, site not specified: Secondary | ICD-10-CM | POA: Diagnosis not present

## 2013-06-13 DIAGNOSIS — Z853 Personal history of malignant neoplasm of breast: Secondary | ICD-10-CM | POA: Diagnosis not present

## 2013-06-13 DIAGNOSIS — I7090 Unspecified atherosclerosis: Secondary | ICD-10-CM | POA: Diagnosis not present

## 2013-06-13 DIAGNOSIS — R6883 Chills (without fever): Secondary | ICD-10-CM | POA: Diagnosis not present

## 2013-06-13 DIAGNOSIS — E119 Type 2 diabetes mellitus without complications: Secondary | ICD-10-CM | POA: Diagnosis not present

## 2013-06-13 DIAGNOSIS — Z7901 Long term (current) use of anticoagulants: Secondary | ICD-10-CM | POA: Diagnosis not present

## 2013-06-13 DIAGNOSIS — I10 Essential (primary) hypertension: Secondary | ICD-10-CM | POA: Diagnosis not present

## 2013-06-13 DIAGNOSIS — Z79899 Other long term (current) drug therapy: Secondary | ICD-10-CM | POA: Diagnosis not present

## 2013-06-16 DIAGNOSIS — E7800 Pure hypercholesterolemia, unspecified: Secondary | ICD-10-CM | POA: Diagnosis not present

## 2013-06-16 DIAGNOSIS — I4891 Unspecified atrial fibrillation: Secondary | ICD-10-CM | POA: Diagnosis not present

## 2013-06-16 DIAGNOSIS — E538 Deficiency of other specified B group vitamins: Secondary | ICD-10-CM | POA: Diagnosis not present

## 2013-06-16 DIAGNOSIS — E559 Vitamin D deficiency, unspecified: Secondary | ICD-10-CM | POA: Diagnosis not present

## 2013-06-16 DIAGNOSIS — N3000 Acute cystitis without hematuria: Secondary | ICD-10-CM | POA: Diagnosis not present

## 2013-06-16 DIAGNOSIS — E876 Hypokalemia: Secondary | ICD-10-CM | POA: Diagnosis not present

## 2013-06-16 DIAGNOSIS — E119 Type 2 diabetes mellitus without complications: Secondary | ICD-10-CM | POA: Diagnosis not present

## 2013-06-18 ENCOUNTER — Other Ambulatory Visit: Payer: Self-pay | Admitting: *Deleted

## 2013-06-18 MED ORDER — DILTIAZEM HCL ER COATED BEADS 180 MG PO CP24: 180.0000 mg | ORAL_CAPSULE | Freq: Every day | ORAL | Status: DC

## 2013-06-24 DIAGNOSIS — E7800 Pure hypercholesterolemia, unspecified: Secondary | ICD-10-CM | POA: Diagnosis not present

## 2013-06-24 DIAGNOSIS — E119 Type 2 diabetes mellitus without complications: Secondary | ICD-10-CM | POA: Diagnosis not present

## 2013-06-24 DIAGNOSIS — G622 Polyneuropathy due to other toxic agents: Secondary | ICD-10-CM | POA: Diagnosis not present

## 2013-06-24 DIAGNOSIS — R634 Abnormal weight loss: Secondary | ICD-10-CM | POA: Diagnosis not present

## 2013-06-24 DIAGNOSIS — I10 Essential (primary) hypertension: Secondary | ICD-10-CM | POA: Diagnosis not present

## 2013-06-24 DIAGNOSIS — G619 Inflammatory polyneuropathy, unspecified: Secondary | ICD-10-CM | POA: Diagnosis not present

## 2013-06-24 DIAGNOSIS — I4891 Unspecified atrial fibrillation: Secondary | ICD-10-CM | POA: Diagnosis not present

## 2013-06-29 ENCOUNTER — Ambulatory Visit (INDEPENDENT_AMBULATORY_CARE_PROVIDER_SITE_OTHER): Payer: Medicare Other | Admitting: *Deleted

## 2013-06-29 DIAGNOSIS — Z7901 Long term (current) use of anticoagulants: Secondary | ICD-10-CM

## 2013-06-29 DIAGNOSIS — Z8679 Personal history of other diseases of the circulatory system: Secondary | ICD-10-CM

## 2013-06-29 DIAGNOSIS — Z5181 Encounter for therapeutic drug level monitoring: Secondary | ICD-10-CM

## 2013-06-29 LAB — POCT INR: INR: 2.1

## 2013-06-30 DIAGNOSIS — Z1231 Encounter for screening mammogram for malignant neoplasm of breast: Secondary | ICD-10-CM | POA: Diagnosis not present

## 2013-07-09 ENCOUNTER — Other Ambulatory Visit: Payer: Self-pay | Admitting: *Deleted

## 2013-07-09 MED ORDER — WARFARIN SODIUM 2.5 MG PO TABS: 5.0000 mg | ORAL_TABLET | Freq: Every day | ORAL | Status: DC

## 2013-07-21 DIAGNOSIS — D239 Other benign neoplasm of skin, unspecified: Secondary | ICD-10-CM | POA: Diagnosis not present

## 2013-07-21 DIAGNOSIS — L821 Other seborrheic keratosis: Secondary | ICD-10-CM | POA: Diagnosis not present

## 2013-07-27 ENCOUNTER — Ambulatory Visit (INDEPENDENT_AMBULATORY_CARE_PROVIDER_SITE_OTHER): Payer: Medicare Other | Admitting: *Deleted

## 2013-07-27 DIAGNOSIS — Z5181 Encounter for therapeutic drug level monitoring: Secondary | ICD-10-CM | POA: Diagnosis not present

## 2013-07-27 DIAGNOSIS — Z8679 Personal history of other diseases of the circulatory system: Secondary | ICD-10-CM

## 2013-07-27 DIAGNOSIS — Z7901 Long term (current) use of anticoagulants: Secondary | ICD-10-CM

## 2013-07-27 LAB — POCT INR: INR: 2.8

## 2013-09-03 ENCOUNTER — Ambulatory Visit (INDEPENDENT_AMBULATORY_CARE_PROVIDER_SITE_OTHER): Payer: Medicare Other | Admitting: *Deleted

## 2013-09-03 DIAGNOSIS — Z8679 Personal history of other diseases of the circulatory system: Secondary | ICD-10-CM

## 2013-09-03 DIAGNOSIS — Z5181 Encounter for therapeutic drug level monitoring: Secondary | ICD-10-CM

## 2013-09-03 DIAGNOSIS — Z7901 Long term (current) use of anticoagulants: Secondary | ICD-10-CM | POA: Diagnosis not present

## 2013-09-03 LAB — POCT INR: INR: 2.3

## 2013-09-13 ENCOUNTER — Other Ambulatory Visit: Payer: Self-pay | Admitting: *Deleted

## 2013-09-13 MED ORDER — LOSARTAN POTASSIUM 50 MG PO TABS: ORAL_TABLET | ORAL | Status: DC

## 2013-10-15 ENCOUNTER — Ambulatory Visit (INDEPENDENT_AMBULATORY_CARE_PROVIDER_SITE_OTHER): Payer: Medicare Other | Admitting: *Deleted

## 2013-10-15 DIAGNOSIS — Z5181 Encounter for therapeutic drug level monitoring: Secondary | ICD-10-CM

## 2013-10-15 DIAGNOSIS — Z7901 Long term (current) use of anticoagulants: Secondary | ICD-10-CM | POA: Diagnosis not present

## 2013-10-15 DIAGNOSIS — Z8679 Personal history of other diseases of the circulatory system: Secondary | ICD-10-CM

## 2013-10-15 LAB — POCT INR: INR: 2.4

## 2013-10-28 ENCOUNTER — Other Ambulatory Visit: Payer: Self-pay | Admitting: *Deleted

## 2013-10-28 MED ORDER — NITROGLYCERIN 0.4 MG SL SUBL: 0.4000 mg | SUBLINGUAL_TABLET | SUBLINGUAL | Status: DC | PRN

## 2013-11-04 ENCOUNTER — Telehealth: Payer: Self-pay | Admitting: *Deleted

## 2013-11-04 DIAGNOSIS — R0789 Other chest pain: Secondary | ICD-10-CM | POA: Diagnosis not present

## 2013-11-04 DIAGNOSIS — R079 Chest pain, unspecified: Secondary | ICD-10-CM | POA: Diagnosis not present

## 2013-11-04 DIAGNOSIS — I4891 Unspecified atrial fibrillation: Secondary | ICD-10-CM | POA: Diagnosis not present

## 2013-11-04 DIAGNOSIS — I10 Essential (primary) hypertension: Secondary | ICD-10-CM | POA: Diagnosis not present

## 2013-11-04 DIAGNOSIS — I2510 Atherosclerotic heart disease of native coronary artery without angina pectoris: Secondary | ICD-10-CM | POA: Diagnosis not present

## 2013-11-04 NOTE — Telephone Encounter (Signed)
Patient called with c/o chest pain last evening.  Stated that it was so bad that she had to take her Nitroglycerin, but could not rate it with a number.  Stated that she did have to take x 2 last evening before getting relief & was finally able to rest around 5:30 this morning.  Questioned why she didn't go to ED & she stated that she just didn't even know and that she just couldn't answer that.  Today she has had dull chest pain pretty much all day (2-3/10).  No c/o SOB.  Does c/o just not feeling well & being really tired.    Discussed above with Dr. Bronson Ing - he advises patient go to ED at [facility] to be evaluated for her chest pain.  Per his last OV note in March, it does state that if she had recurrent chest pain he may consider coronary angiography.  Informed patient that this was the best option for her as they can address all her issues in an acute setting (labs, coumadin management, cath) if necessary.  Patient verbalized understanding & stated that she would go this evening.

## 2013-11-05 ENCOUNTER — Ambulatory Visit: Payer: Medicare Other | Admitting: Cardiovascular Disease

## 2013-11-05 DIAGNOSIS — R079 Chest pain, unspecified: Secondary | ICD-10-CM | POA: Diagnosis not present

## 2013-11-10 ENCOUNTER — Other Ambulatory Visit: Payer: Self-pay | Admitting: *Deleted

## 2013-11-10 MED ORDER — WARFARIN SODIUM 2.5 MG PO TABS: 5.0000 mg | ORAL_TABLET | Freq: Every day | ORAL | Status: DC

## 2013-11-15 DIAGNOSIS — Z23 Encounter for immunization: Secondary | ICD-10-CM | POA: Diagnosis not present

## 2013-11-18 ENCOUNTER — Ambulatory Visit: Payer: Medicare Other | Admitting: Cardiovascular Disease

## 2013-11-24 ENCOUNTER — Ambulatory Visit (INDEPENDENT_AMBULATORY_CARE_PROVIDER_SITE_OTHER): Payer: Medicare Other | Admitting: Cardiovascular Disease

## 2013-11-24 ENCOUNTER — Encounter: Payer: Self-pay | Admitting: Cardiovascular Disease

## 2013-11-24 VITALS — BP 176/74 | HR 56 | Ht 60.0 in | Wt 116.8 lb

## 2013-11-24 DIAGNOSIS — Z7901 Long term (current) use of anticoagulants: Secondary | ICD-10-CM | POA: Diagnosis not present

## 2013-11-24 DIAGNOSIS — Z9289 Personal history of other medical treatment: Secondary | ICD-10-CM

## 2013-11-24 DIAGNOSIS — I4891 Unspecified atrial fibrillation: Secondary | ICD-10-CM | POA: Diagnosis not present

## 2013-11-24 DIAGNOSIS — I10 Essential (primary) hypertension: Secondary | ICD-10-CM | POA: Diagnosis not present

## 2013-11-24 DIAGNOSIS — R079 Chest pain, unspecified: Secondary | ICD-10-CM

## 2013-11-24 DIAGNOSIS — Z87898 Personal history of other specified conditions: Secondary | ICD-10-CM

## 2013-11-24 DIAGNOSIS — I4819 Other persistent atrial fibrillation: Secondary | ICD-10-CM

## 2013-11-24 MED ORDER — LOSARTAN POTASSIUM 50 MG PO TABS: 50.0000 mg | ORAL_TABLET | Freq: Two times a day (BID) | ORAL | Status: DC

## 2013-11-24 NOTE — Progress Notes (Signed)
Patient ID: Judith Chung, female   DOB: Apr 20, 1924, 80 y.o.   MRN: [id number]      SUBJECTIVE: Judith Chung is an 80 year old female with a history of paroxysmal atrial fibrillation and atrial flutter as well as malignant HTN. She is on chronic Coumadin therapy, and denies bleeding problems. She has a history of normal LV function and nonobstructive coronary disease by cardiac catheterization 2005.  She was admitted to [facility] in August 2014 with chest pain and hypokalemia. She underwent a Lexiscan Cardiolite stress test which showed no evidence of ischemia or scar and was a low-risk study, which I interpreted.  I previously tried her on amlodipine, chlorthalidone, and hydralzine for HTN, but she did not tolerate these medications. I tried Marisa Severin but it was too expensive for her. I subsequently started her on losartan.  She has had chronic problems with nocturia and constipation.  She was hospitalized for chest pain at [facility] on September 10. It was deemed to be atypical and likely due to costochondritis. Troponins were normal. CT angiography of the chest did not demonstrate any evidence of pulmonary embolus. Chest x-ray was also normal. ECG demonstrated normal sinus rhythm with LVH.   She currently feels well and denies chest pain shortness of breath, and believes her prior episode of chest pain was related to arthritis of the chest wall and back. It is alleviated with Tylenol.  Review of Systems: As per "subjective", otherwise negative.  Allergies  Allergen Reactions  . Alprazolam     REACTION: sleepy  . Amlodipine Other (See Comments)    Edema feet & legs, nausea  . Atorvastatin     REACTION: myalgia  . Azithromycin     REACTION: rash  . Cephalexin     REACTION: rash  . Chlorthalidone     Nausea, loss of appetite, insomnia, generally did not feel well  . Crestor [Rosuvastatin]   . Epinephrine     REACTION: B/P,chest pain  . Inderal [Propranolol]   . Norvasc  [Amlodipine Besylate]   . Penicillins     REACTION: rash  . Polysporin [Bacitracin-Polymyxin B]   . Premarin [Conjugated Estrogens]     rash  . Propranolol Hcl     REACTION: rash  . Quinapril Hcl     REACTION: rash  . Statins     REACTION: myalgia  . Sulfamethoxazole-Trimethoprim   . Zetia [Ezetimibe]     Current Outpatient Prescriptions  Medication Sig Dispense Refill  . Cholecalciferol (VITAMIN D3) 400 UNITS tablet Take 400 Units by mouth daily.       . cyanocobalamin 500 MCG tablet Take 500 mcg by mouth every 30 (thirty) days.       . diazepam (VALIUM) 5 MG tablet Take 5 mg by mouth daily as needed. Anxiety.      Marland Kitchen diltiazem (CARDIZEM CD) 180 MG 24 hr capsule Take 1 capsule (180 mg total) by mouth daily.  30 capsule  6  . losartan (COZAAR) 50 MG tablet Take 50mg  every morning & 25mg  every evening  45 tablet  6  . nitroGLYCERIN (NITROSTAT) 0.4 MG SL tablet Place 1 tablet (0.4 mg total) under the tongue every 5 (five) minutes as needed for chest pain.  25 tablet  3  . warfarin (COUMADIN) 2.5 MG tablet Take 2 tablets (5 mg total) by mouth daily at 6 PM.  60 tablet  3   No current facility-administered medications for this visit.    Past Medical History  Diagnosis Date  . PAF (  paroxysmal atrial fibrillation)   . HTN (hypertension)   . DM2 (diabetes mellitus, type 2)   . MR (mitral regurgitation)     mild  . CAD (coronary artery disease)     nonobstructive  . Dyslipidemia   . Drug intolerance     intolerant to numerous statins and Zetia  . Anxiety disorder     generalized  . Arthritis   . Breast cancer 1988    s/p right lumpectomy  . Colon cancer 1991    s/p right hemicolectom    Past Surgical History  Procedure Laterality Date  . Chronic coumadin      CHAD2:3 (medical hx)  . Colon surgery  1991    rt  . Laproscopic cholecystectomy    . Tonsillectomy    . Cataract extraction w/ intraocular lens  implant, bilateral      no cataract extraction   . Breast  lumpectomy  1988    rt  . Appendectomy    . Tonsillectomy    . Cholecystectomy    . Cardiac catheterization  2005    [place]: nonobstructive coronary disease, normal LV function    History   Social History  . Marital Status: Married    Spouse Name: N/A    Number of Children: N/A  . Years of Education: N/A   Occupational History  . Not on file.   Social History Main Topics  . Smoking status: Never Smoker   . Smokeless tobacco: Never Used     Comment: tobacco use - no  . Alcohol Use: No  . Drug Use: No  . Sexual Activity: Not on file   Other Topics Concern  . Not on file   Social History Narrative  . No narrative on file     Filed Vitals:   11/24/13 1144  BP: 176/74  Pulse: 56  Height: 5' (1.524 m)  Weight: 116 lb 12 oz (52.957 kg)  SpO2: 97%    PHYSICAL EXAM General: NAD HEENT: Normal. Neck: No JVD, no thyromegaly. Lungs: Clear to auscultation bilaterally with normal respiratory effort. CV: Nondisplaced PMI.  Regular rate and rhythm, normal S1/S2, no S3/S4, no murmur. No pretibial or periankle edema.  No carotid bruit.  Normal pedal pulses.  Abdomen: Soft, nontender, no hepatosplenomegaly, no distention.  Neurologic: Alert and oriented x 3.  Psych: Normal affect. Skin: Normal. Musculoskeletal: Normal range of motion, no gross deformities. Extremities: No clubbing or cyanosis.   ECG: Most recent ECG reviewed.      ASSESSMENT AND PLAN: 1. Atrial fibrillation: Currently in a regular rhythm and tolerating warfarin. No changes in therapy. On long-acting diltiazem 180 mg daily. 2. Malignant HTN: Elevated. Will increase losartan to 50 mg bid. Pt confirms BP fluctuates at home. Intolerant to several antihypertensives in the past.  3. Chest pain: Likely musculoskeletal (arthritic) in etiology, as it is relieved with Tylenol. She had a normal myocardial perfusion study on 10/13/12.  Dispo: f/u 6 months.  Kate Sable, M.D., F.A.C.C.

## 2013-11-24 NOTE — Patient Instructions (Signed)
   Increase Losartan to 50mg  twice a day  Continue all other medications.   Your physician wants you to follow up in: 6 months.  You will receive a reminder letter in the mail one-two months in advance.  If you don't receive a letter, please call our office to schedule the follow up appointment.

## 2013-11-26 ENCOUNTER — Ambulatory Visit (INDEPENDENT_AMBULATORY_CARE_PROVIDER_SITE_OTHER): Payer: Medicare Other | Admitting: *Deleted

## 2013-11-26 DIAGNOSIS — Z7901 Long term (current) use of anticoagulants: Secondary | ICD-10-CM | POA: Diagnosis not present

## 2013-11-26 DIAGNOSIS — Z5181 Encounter for therapeutic drug level monitoring: Secondary | ICD-10-CM

## 2013-11-26 DIAGNOSIS — Z8679 Personal history of other diseases of the circulatory system: Secondary | ICD-10-CM

## 2013-11-26 LAB — POCT INR: INR: 2.4

## 2013-12-04 ENCOUNTER — Encounter: Payer: Self-pay | Admitting: Cardiovascular Disease

## 2013-12-05 DIAGNOSIS — I481 Persistent atrial fibrillation: Secondary | ICD-10-CM | POA: Diagnosis not present

## 2013-12-05 DIAGNOSIS — E119 Type 2 diabetes mellitus without complications: Secondary | ICD-10-CM | POA: Diagnosis not present

## 2013-12-05 DIAGNOSIS — E780 Pure hypercholesterolemia: Secondary | ICD-10-CM | POA: Diagnosis not present

## 2013-12-05 DIAGNOSIS — R079 Chest pain, unspecified: Secondary | ICD-10-CM | POA: Diagnosis not present

## 2013-12-09 DIAGNOSIS — L03032 Cellulitis of left toe: Secondary | ICD-10-CM | POA: Diagnosis not present

## 2013-12-15 ENCOUNTER — Telehealth: Payer: Self-pay | Admitting: *Deleted

## 2013-12-15 DIAGNOSIS — L03032 Cellulitis of left toe: Secondary | ICD-10-CM | POA: Diagnosis not present

## 2013-12-15 NOTE — Telephone Encounter (Signed)
Pt is starting on Doxycycline 100mg  bid tonight x 5 days for toe infection.  Told pt to take coumadin 5mg  tonight, 2.5mg  on Thurs,Fri,Sat,Sun, 5mg  on Mon and come for INR check on Tuesday.  She verbalized understanding.

## 2013-12-15 NOTE — Telephone Encounter (Signed)
Mrs. Haack saw PCP today for cellulitis of left great toe. They want to put her on Doxycyline x 5 days.  She wants to know if this will interfer With her coumdin.   Please call her home # .

## 2013-12-21 ENCOUNTER — Ambulatory Visit (INDEPENDENT_AMBULATORY_CARE_PROVIDER_SITE_OTHER): Payer: Medicare Other | Admitting: *Deleted

## 2013-12-21 DIAGNOSIS — Z5181 Encounter for therapeutic drug level monitoring: Secondary | ICD-10-CM

## 2013-12-21 DIAGNOSIS — D519 Vitamin B12 deficiency anemia, unspecified: Secondary | ICD-10-CM | POA: Diagnosis not present

## 2013-12-21 DIAGNOSIS — Z8679 Personal history of other diseases of the circulatory system: Secondary | ICD-10-CM

## 2013-12-21 DIAGNOSIS — E559 Vitamin D deficiency, unspecified: Secondary | ICD-10-CM | POA: Diagnosis not present

## 2013-12-21 DIAGNOSIS — E780 Pure hypercholesterolemia: Secondary | ICD-10-CM | POA: Diagnosis not present

## 2013-12-21 DIAGNOSIS — E876 Hypokalemia: Secondary | ICD-10-CM | POA: Diagnosis not present

## 2013-12-21 DIAGNOSIS — G619 Inflammatory polyneuropathy, unspecified: Secondary | ICD-10-CM | POA: Diagnosis not present

## 2013-12-21 DIAGNOSIS — I4891 Unspecified atrial fibrillation: Secondary | ICD-10-CM | POA: Diagnosis not present

## 2013-12-21 DIAGNOSIS — Z7901 Long term (current) use of anticoagulants: Secondary | ICD-10-CM

## 2013-12-21 DIAGNOSIS — E119 Type 2 diabetes mellitus without complications: Secondary | ICD-10-CM | POA: Diagnosis not present

## 2013-12-21 DIAGNOSIS — E162 Hypoglycemia, unspecified: Secondary | ICD-10-CM | POA: Diagnosis not present

## 2013-12-21 DIAGNOSIS — R634 Abnormal weight loss: Secondary | ICD-10-CM | POA: Diagnosis not present

## 2013-12-21 LAB — POCT INR: INR: 1.9

## 2013-12-22 DIAGNOSIS — Z853 Personal history of malignant neoplasm of breast: Secondary | ICD-10-CM | POA: Diagnosis not present

## 2013-12-22 DIAGNOSIS — S37009A Unspecified injury of unspecified kidney, initial encounter: Secondary | ICD-10-CM | POA: Diagnosis not present

## 2013-12-22 DIAGNOSIS — G8194 Hemiplegia, unspecified affecting left nondominant side: Secondary | ICD-10-CM | POA: Diagnosis present

## 2013-12-22 DIAGNOSIS — I129 Hypertensive chronic kidney disease with stage 1 through stage 4 chronic kidney disease, or unspecified chronic kidney disease: Secondary | ICD-10-CM | POA: Diagnosis not present

## 2013-12-22 DIAGNOSIS — R262 Difficulty in walking, not elsewhere classified: Secondary | ICD-10-CM | POA: Diagnosis not present

## 2013-12-22 DIAGNOSIS — N179 Acute kidney failure, unspecified: Secondary | ICD-10-CM | POA: Diagnosis present

## 2013-12-22 DIAGNOSIS — I638 Other cerebral infarction: Secondary | ICD-10-CM | POA: Diagnosis not present

## 2013-12-22 DIAGNOSIS — Z8673 Personal history of transient ischemic attack (TIA), and cerebral infarction without residual deficits: Secondary | ICD-10-CM | POA: Diagnosis not present

## 2013-12-22 DIAGNOSIS — Z7901 Long term (current) use of anticoagulants: Secondary | ICD-10-CM | POA: Diagnosis not present

## 2013-12-22 DIAGNOSIS — I340 Nonrheumatic mitral (valve) insufficiency: Secondary | ICD-10-CM | POA: Diagnosis not present

## 2013-12-22 DIAGNOSIS — H9193 Unspecified hearing loss, bilateral: Secondary | ICD-10-CM | POA: Diagnosis present

## 2013-12-22 DIAGNOSIS — Z888 Allergy status to other drugs, medicaments and biological substances status: Secondary | ICD-10-CM | POA: Diagnosis not present

## 2013-12-22 DIAGNOSIS — N184 Chronic kidney disease, stage 4 (severe): Secondary | ICD-10-CM | POA: Diagnosis not present

## 2013-12-22 DIAGNOSIS — R51 Headache: Secondary | ICD-10-CM | POA: Diagnosis not present

## 2013-12-22 DIAGNOSIS — I6789 Other cerebrovascular disease: Secondary | ICD-10-CM | POA: Diagnosis not present

## 2013-12-22 DIAGNOSIS — R29810 Facial weakness: Secondary | ICD-10-CM | POA: Diagnosis not present

## 2013-12-22 DIAGNOSIS — I639 Cerebral infarction, unspecified: Secondary | ICD-10-CM | POA: Diagnosis not present

## 2013-12-22 DIAGNOSIS — I6350 Cerebral infarction due to unspecified occlusion or stenosis of unspecified cerebral artery: Secondary | ICD-10-CM | POA: Diagnosis not present

## 2013-12-22 DIAGNOSIS — Z79899 Other long term (current) drug therapy: Secondary | ICD-10-CM | POA: Diagnosis not present

## 2013-12-22 DIAGNOSIS — I10 Essential (primary) hypertension: Secondary | ICD-10-CM | POA: Diagnosis not present

## 2013-12-22 DIAGNOSIS — R42 Dizziness and giddiness: Secondary | ICD-10-CM | POA: Diagnosis not present

## 2013-12-22 DIAGNOSIS — Z883 Allergy status to other anti-infective agents status: Secondary | ICD-10-CM | POA: Diagnosis not present

## 2013-12-22 DIAGNOSIS — Z880 Allergy status to penicillin: Secondary | ICD-10-CM | POA: Diagnosis not present

## 2013-12-22 DIAGNOSIS — E119 Type 2 diabetes mellitus without complications: Secondary | ICD-10-CM | POA: Diagnosis present

## 2013-12-22 DIAGNOSIS — I517 Cardiomegaly: Secondary | ICD-10-CM | POA: Diagnosis not present

## 2013-12-22 DIAGNOSIS — Z85038 Personal history of other malignant neoplasm of large intestine: Secondary | ICD-10-CM | POA: Diagnosis not present

## 2013-12-22 DIAGNOSIS — H538 Other visual disturbances: Secondary | ICD-10-CM | POA: Diagnosis not present

## 2013-12-22 DIAGNOSIS — I2510 Atherosclerotic heart disease of native coronary artery without angina pectoris: Secondary | ICD-10-CM | POA: Diagnosis not present

## 2013-12-22 DIAGNOSIS — F0390 Unspecified dementia without behavioral disturbance: Secondary | ICD-10-CM | POA: Diagnosis present

## 2013-12-22 DIAGNOSIS — R269 Unspecified abnormalities of gait and mobility: Secondary | ICD-10-CM | POA: Diagnosis present

## 2013-12-23 DIAGNOSIS — I10 Essential (primary) hypertension: Secondary | ICD-10-CM | POA: Diagnosis not present

## 2013-12-24 DIAGNOSIS — I6789 Other cerebrovascular disease: Secondary | ICD-10-CM | POA: Diagnosis not present

## 2013-12-29 DIAGNOSIS — E119 Type 2 diabetes mellitus without complications: Secondary | ICD-10-CM | POA: Diagnosis not present

## 2013-12-29 DIAGNOSIS — I6931 Cognitive deficits following cerebral infarction: Secondary | ICD-10-CM | POA: Diagnosis not present

## 2013-12-29 DIAGNOSIS — N189 Chronic kidney disease, unspecified: Secondary | ICD-10-CM | POA: Diagnosis not present

## 2013-12-29 DIAGNOSIS — I4891 Unspecified atrial fibrillation: Secondary | ICD-10-CM | POA: Diagnosis not present

## 2013-12-29 DIAGNOSIS — I129 Hypertensive chronic kidney disease with stage 1 through stage 4 chronic kidney disease, or unspecified chronic kidney disease: Secondary | ICD-10-CM | POA: Diagnosis not present

## 2013-12-30 ENCOUNTER — Telehealth: Payer: Self-pay | Admitting: *Deleted

## 2013-12-30 DIAGNOSIS — N189 Chronic kidney disease, unspecified: Secondary | ICD-10-CM | POA: Diagnosis not present

## 2013-12-30 DIAGNOSIS — E119 Type 2 diabetes mellitus without complications: Secondary | ICD-10-CM | POA: Diagnosis not present

## 2013-12-30 DIAGNOSIS — I129 Hypertensive chronic kidney disease with stage 1 through stage 4 chronic kidney disease, or unspecified chronic kidney disease: Secondary | ICD-10-CM | POA: Diagnosis not present

## 2013-12-30 DIAGNOSIS — I4891 Unspecified atrial fibrillation: Secondary | ICD-10-CM | POA: Diagnosis not present

## 2013-12-30 DIAGNOSIS — I6931 Cognitive deficits following cerebral infarction: Secondary | ICD-10-CM | POA: Diagnosis not present

## 2013-12-30 NOTE — Telephone Encounter (Signed)
Received call from [facility].  Pt was D/C from [facility] on 10/31 with Dx of Acute ischemic thrombotic CVA and admitted to their services.  She wants to know when pt needs PT/INR.  States she called Dr Nadara Mustard and he told her to call us since we manage her coumadin.  Pt's admission INR on 10/28 was 2.1 on coumadin 5mg  daily except 2.5mg  on Tuesdays and Fridays.  She was D/C'd on 10/31 on coumadin 5mg  daily.  Order given for [facility] to check INR tomorrow (11/6) and call results to [facility] for further orders.  Pt was not started on any other new meds.

## 2013-12-31 ENCOUNTER — Ambulatory Visit (INDEPENDENT_AMBULATORY_CARE_PROVIDER_SITE_OTHER): Payer: Medicare Other | Admitting: Pharmacist

## 2013-12-31 DIAGNOSIS — Z5181 Encounter for therapeutic drug level monitoring: Secondary | ICD-10-CM

## 2013-12-31 DIAGNOSIS — I129 Hypertensive chronic kidney disease with stage 1 through stage 4 chronic kidney disease, or unspecified chronic kidney disease: Secondary | ICD-10-CM | POA: Diagnosis not present

## 2013-12-31 DIAGNOSIS — E119 Type 2 diabetes mellitus without complications: Secondary | ICD-10-CM | POA: Diagnosis not present

## 2013-12-31 DIAGNOSIS — I6931 Cognitive deficits following cerebral infarction: Secondary | ICD-10-CM | POA: Diagnosis not present

## 2013-12-31 DIAGNOSIS — I4891 Unspecified atrial fibrillation: Secondary | ICD-10-CM | POA: Diagnosis not present

## 2013-12-31 DIAGNOSIS — N189 Chronic kidney disease, unspecified: Secondary | ICD-10-CM | POA: Diagnosis not present

## 2013-12-31 LAB — POCT INR: INR: 3.7

## 2014-01-03 DIAGNOSIS — I4891 Unspecified atrial fibrillation: Secondary | ICD-10-CM | POA: Diagnosis not present

## 2014-01-03 DIAGNOSIS — I6931 Cognitive deficits following cerebral infarction: Secondary | ICD-10-CM | POA: Diagnosis not present

## 2014-01-03 DIAGNOSIS — N189 Chronic kidney disease, unspecified: Secondary | ICD-10-CM | POA: Diagnosis not present

## 2014-01-03 DIAGNOSIS — E119 Type 2 diabetes mellitus without complications: Secondary | ICD-10-CM | POA: Diagnosis not present

## 2014-01-03 DIAGNOSIS — I129 Hypertensive chronic kidney disease with stage 1 through stage 4 chronic kidney disease, or unspecified chronic kidney disease: Secondary | ICD-10-CM | POA: Diagnosis not present

## 2014-01-04 ENCOUNTER — Telehealth: Payer: Self-pay | Admitting: *Deleted

## 2014-01-04 DIAGNOSIS — N189 Chronic kidney disease, unspecified: Secondary | ICD-10-CM | POA: Diagnosis not present

## 2014-01-04 DIAGNOSIS — I4891 Unspecified atrial fibrillation: Secondary | ICD-10-CM | POA: Diagnosis not present

## 2014-01-04 DIAGNOSIS — E119 Type 2 diabetes mellitus without complications: Secondary | ICD-10-CM | POA: Diagnosis not present

## 2014-01-04 DIAGNOSIS — I129 Hypertensive chronic kidney disease with stage 1 through stage 4 chronic kidney disease, or unspecified chronic kidney disease: Secondary | ICD-10-CM | POA: Diagnosis not present

## 2014-01-04 DIAGNOSIS — I6931 Cognitive deficits following cerebral infarction: Secondary | ICD-10-CM | POA: Diagnosis not present

## 2014-01-04 NOTE — Telephone Encounter (Signed)
Message left on voice mail by Janace Hoard w/ [facility] - reporting that they are now seeing her post CVA.  Wanted to make [place] aware of her BP readings.  When she arrived to house - 180/82.  Rechecked - 138/90 & then standing - 120/80.    States that she will continue to monitor BP readings at every visit.    Patient is not due for follow up until March 2016 (6 mo).

## 2014-01-05 DIAGNOSIS — N189 Chronic kidney disease, unspecified: Secondary | ICD-10-CM | POA: Diagnosis not present

## 2014-01-05 DIAGNOSIS — E119 Type 2 diabetes mellitus without complications: Secondary | ICD-10-CM | POA: Diagnosis not present

## 2014-01-05 DIAGNOSIS — I4891 Unspecified atrial fibrillation: Secondary | ICD-10-CM | POA: Diagnosis not present

## 2014-01-05 DIAGNOSIS — I6931 Cognitive deficits following cerebral infarction: Secondary | ICD-10-CM | POA: Diagnosis not present

## 2014-01-05 DIAGNOSIS — I129 Hypertensive chronic kidney disease with stage 1 through stage 4 chronic kidney disease, or unspecified chronic kidney disease: Secondary | ICD-10-CM | POA: Diagnosis not present

## 2014-01-06 ENCOUNTER — Ambulatory Visit (INDEPENDENT_AMBULATORY_CARE_PROVIDER_SITE_OTHER): Payer: Medicare Other | Admitting: *Deleted

## 2014-01-06 ENCOUNTER — Telehealth: Payer: Self-pay | Admitting: *Deleted

## 2014-01-06 DIAGNOSIS — N189 Chronic kidney disease, unspecified: Secondary | ICD-10-CM | POA: Diagnosis not present

## 2014-01-06 DIAGNOSIS — I4891 Unspecified atrial fibrillation: Secondary | ICD-10-CM | POA: Diagnosis not present

## 2014-01-06 DIAGNOSIS — I6931 Cognitive deficits following cerebral infarction: Secondary | ICD-10-CM | POA: Diagnosis not present

## 2014-01-06 DIAGNOSIS — E119 Type 2 diabetes mellitus without complications: Secondary | ICD-10-CM | POA: Diagnosis not present

## 2014-01-06 DIAGNOSIS — I129 Hypertensive chronic kidney disease with stage 1 through stage 4 chronic kidney disease, or unspecified chronic kidney disease: Secondary | ICD-10-CM | POA: Diagnosis not present

## 2014-01-06 DIAGNOSIS — Z5181 Encounter for therapeutic drug level monitoring: Secondary | ICD-10-CM

## 2014-01-06 LAB — POCT INR: INR: 2.8

## 2014-01-06 NOTE — Telephone Encounter (Signed)
Mrs.  Siegenthaler called stating that she would like to speak with [name] regarding her CCR check for 01-11-14

## 2014-01-06 NOTE — Telephone Encounter (Signed)
Pt had INR appt in office on 11/17 but [facility] is check INR on 11/18 so office appt cancelled. Pt understands.

## 2014-01-10 DIAGNOSIS — I6931 Cognitive deficits following cerebral infarction: Secondary | ICD-10-CM | POA: Diagnosis not present

## 2014-01-10 DIAGNOSIS — I129 Hypertensive chronic kidney disease with stage 1 through stage 4 chronic kidney disease, or unspecified chronic kidney disease: Secondary | ICD-10-CM | POA: Diagnosis not present

## 2014-01-10 DIAGNOSIS — E119 Type 2 diabetes mellitus without complications: Secondary | ICD-10-CM | POA: Diagnosis not present

## 2014-01-10 DIAGNOSIS — N189 Chronic kidney disease, unspecified: Secondary | ICD-10-CM | POA: Diagnosis not present

## 2014-01-10 DIAGNOSIS — I4891 Unspecified atrial fibrillation: Secondary | ICD-10-CM | POA: Diagnosis not present

## 2014-01-12 ENCOUNTER — Ambulatory Visit (INDEPENDENT_AMBULATORY_CARE_PROVIDER_SITE_OTHER): Payer: Medicare Other | Admitting: *Deleted

## 2014-01-12 DIAGNOSIS — Z5181 Encounter for therapeutic drug level monitoring: Secondary | ICD-10-CM

## 2014-01-12 DIAGNOSIS — N189 Chronic kidney disease, unspecified: Secondary | ICD-10-CM | POA: Diagnosis not present

## 2014-01-12 DIAGNOSIS — I4891 Unspecified atrial fibrillation: Secondary | ICD-10-CM | POA: Diagnosis not present

## 2014-01-12 DIAGNOSIS — E119 Type 2 diabetes mellitus without complications: Secondary | ICD-10-CM | POA: Diagnosis not present

## 2014-01-12 DIAGNOSIS — I6931 Cognitive deficits following cerebral infarction: Secondary | ICD-10-CM | POA: Diagnosis not present

## 2014-01-12 DIAGNOSIS — I129 Hypertensive chronic kidney disease with stage 1 through stage 4 chronic kidney disease, or unspecified chronic kidney disease: Secondary | ICD-10-CM | POA: Diagnosis not present

## 2014-01-12 LAB — POCT INR: INR: 3.1

## 2014-01-17 ENCOUNTER — Other Ambulatory Visit: Payer: Self-pay | Admitting: *Deleted

## 2014-01-17 DIAGNOSIS — N189 Chronic kidney disease, unspecified: Secondary | ICD-10-CM | POA: Diagnosis not present

## 2014-01-17 DIAGNOSIS — I4891 Unspecified atrial fibrillation: Secondary | ICD-10-CM | POA: Diagnosis not present

## 2014-01-17 DIAGNOSIS — M79675 Pain in left toe(s): Secondary | ICD-10-CM | POA: Diagnosis not present

## 2014-01-17 DIAGNOSIS — E119 Type 2 diabetes mellitus without complications: Secondary | ICD-10-CM | POA: Diagnosis not present

## 2014-01-17 DIAGNOSIS — I6931 Cognitive deficits following cerebral infarction: Secondary | ICD-10-CM | POA: Diagnosis not present

## 2014-01-17 DIAGNOSIS — L03032 Cellulitis of left toe: Secondary | ICD-10-CM | POA: Diagnosis not present

## 2014-01-17 DIAGNOSIS — L600 Ingrowing nail: Secondary | ICD-10-CM | POA: Diagnosis not present

## 2014-01-17 DIAGNOSIS — I129 Hypertensive chronic kidney disease with stage 1 through stage 4 chronic kidney disease, or unspecified chronic kidney disease: Secondary | ICD-10-CM | POA: Diagnosis not present

## 2014-01-17 MED ORDER — DILTIAZEM HCL ER COATED BEADS 180 MG PO CP24: 180.0000 mg | ORAL_CAPSULE | Freq: Every day | ORAL | Status: DC

## 2014-01-18 DIAGNOSIS — E119 Type 2 diabetes mellitus without complications: Secondary | ICD-10-CM | POA: Diagnosis not present

## 2014-01-18 DIAGNOSIS — I6931 Cognitive deficits following cerebral infarction: Secondary | ICD-10-CM | POA: Diagnosis not present

## 2014-01-18 DIAGNOSIS — I129 Hypertensive chronic kidney disease with stage 1 through stage 4 chronic kidney disease, or unspecified chronic kidney disease: Secondary | ICD-10-CM | POA: Diagnosis not present

## 2014-01-18 DIAGNOSIS — N189 Chronic kidney disease, unspecified: Secondary | ICD-10-CM | POA: Diagnosis not present

## 2014-01-18 DIAGNOSIS — I4891 Unspecified atrial fibrillation: Secondary | ICD-10-CM | POA: Diagnosis not present

## 2014-01-19 DIAGNOSIS — I6931 Cognitive deficits following cerebral infarction: Secondary | ICD-10-CM | POA: Diagnosis not present

## 2014-01-19 DIAGNOSIS — I4891 Unspecified atrial fibrillation: Secondary | ICD-10-CM | POA: Diagnosis not present

## 2014-01-19 DIAGNOSIS — N189 Chronic kidney disease, unspecified: Secondary | ICD-10-CM | POA: Diagnosis not present

## 2014-01-19 DIAGNOSIS — I129 Hypertensive chronic kidney disease with stage 1 through stage 4 chronic kidney disease, or unspecified chronic kidney disease: Secondary | ICD-10-CM | POA: Diagnosis not present

## 2014-01-19 DIAGNOSIS — E119 Type 2 diabetes mellitus without complications: Secondary | ICD-10-CM | POA: Diagnosis not present

## 2014-01-24 DIAGNOSIS — I129 Hypertensive chronic kidney disease with stage 1 through stage 4 chronic kidney disease, or unspecified chronic kidney disease: Secondary | ICD-10-CM | POA: Diagnosis not present

## 2014-01-24 DIAGNOSIS — E119 Type 2 diabetes mellitus without complications: Secondary | ICD-10-CM | POA: Diagnosis not present

## 2014-01-24 DIAGNOSIS — I4891 Unspecified atrial fibrillation: Secondary | ICD-10-CM | POA: Diagnosis not present

## 2014-01-24 DIAGNOSIS — I6931 Cognitive deficits following cerebral infarction: Secondary | ICD-10-CM | POA: Diagnosis not present

## 2014-01-24 DIAGNOSIS — N189 Chronic kidney disease, unspecified: Secondary | ICD-10-CM | POA: Diagnosis not present

## 2014-01-25 DIAGNOSIS — L03032 Cellulitis of left toe: Secondary | ICD-10-CM | POA: Diagnosis not present

## 2014-01-25 DIAGNOSIS — L600 Ingrowing nail: Secondary | ICD-10-CM | POA: Diagnosis not present

## 2014-01-25 DIAGNOSIS — M79675 Pain in left toe(s): Secondary | ICD-10-CM | POA: Diagnosis not present

## 2014-01-27 ENCOUNTER — Ambulatory Visit (INDEPENDENT_AMBULATORY_CARE_PROVIDER_SITE_OTHER): Payer: Medicare Other | Admitting: *Deleted

## 2014-01-27 DIAGNOSIS — Z5181 Encounter for therapeutic drug level monitoring: Secondary | ICD-10-CM

## 2014-01-27 DIAGNOSIS — Z7901 Long term (current) use of anticoagulants: Secondary | ICD-10-CM

## 2014-01-27 DIAGNOSIS — Z8679 Personal history of other diseases of the circulatory system: Secondary | ICD-10-CM | POA: Diagnosis not present

## 2014-01-27 LAB — POCT INR: INR: 2.2

## 2014-01-31 ENCOUNTER — Ambulatory Visit (INDEPENDENT_AMBULATORY_CARE_PROVIDER_SITE_OTHER): Payer: Medicare Other | Admitting: *Deleted

## 2014-01-31 DIAGNOSIS — I4891 Unspecified atrial fibrillation: Secondary | ICD-10-CM | POA: Diagnosis not present

## 2014-01-31 DIAGNOSIS — N189 Chronic kidney disease, unspecified: Secondary | ICD-10-CM | POA: Diagnosis not present

## 2014-01-31 DIAGNOSIS — E119 Type 2 diabetes mellitus without complications: Secondary | ICD-10-CM | POA: Diagnosis not present

## 2014-01-31 DIAGNOSIS — Z5181 Encounter for therapeutic drug level monitoring: Secondary | ICD-10-CM

## 2014-01-31 DIAGNOSIS — I6931 Cognitive deficits following cerebral infarction: Secondary | ICD-10-CM | POA: Diagnosis not present

## 2014-01-31 DIAGNOSIS — I129 Hypertensive chronic kidney disease with stage 1 through stage 4 chronic kidney disease, or unspecified chronic kidney disease: Secondary | ICD-10-CM | POA: Diagnosis not present

## 2014-01-31 LAB — POCT INR: INR: 2.1

## 2014-02-02 DIAGNOSIS — R634 Abnormal weight loss: Secondary | ICD-10-CM | POA: Diagnosis not present

## 2014-02-15 ENCOUNTER — Ambulatory Visit (INDEPENDENT_AMBULATORY_CARE_PROVIDER_SITE_OTHER): Payer: Medicare Other | Admitting: *Deleted

## 2014-02-15 DIAGNOSIS — Z8679 Personal history of other diseases of the circulatory system: Secondary | ICD-10-CM

## 2014-02-15 DIAGNOSIS — Z5181 Encounter for therapeutic drug level monitoring: Secondary | ICD-10-CM

## 2014-02-15 DIAGNOSIS — Z7901 Long term (current) use of anticoagulants: Secondary | ICD-10-CM

## 2014-02-15 LAB — POCT INR: INR: 2.4

## 2014-02-21 ENCOUNTER — Ambulatory Visit (INDEPENDENT_AMBULATORY_CARE_PROVIDER_SITE_OTHER): Payer: Medicare Other | Admitting: Cardiovascular Disease

## 2014-02-21 ENCOUNTER — Encounter: Payer: Self-pay | Admitting: Cardiovascular Disease

## 2014-02-21 VITALS — BP 166/83 | HR 73 | Ht <= 58 in | Wt 110.0 lb

## 2014-02-21 DIAGNOSIS — Z87898 Personal history of other specified conditions: Secondary | ICD-10-CM

## 2014-02-21 DIAGNOSIS — I639 Cerebral infarction, unspecified: Secondary | ICD-10-CM

## 2014-02-21 DIAGNOSIS — I10 Essential (primary) hypertension: Secondary | ICD-10-CM

## 2014-02-21 DIAGNOSIS — Z9289 Personal history of other medical treatment: Secondary | ICD-10-CM

## 2014-02-21 DIAGNOSIS — Z7901 Long term (current) use of anticoagulants: Secondary | ICD-10-CM | POA: Diagnosis not present

## 2014-02-21 DIAGNOSIS — R079 Chest pain, unspecified: Secondary | ICD-10-CM

## 2014-02-21 DIAGNOSIS — I480 Paroxysmal atrial fibrillation: Secondary | ICD-10-CM | POA: Diagnosis not present

## 2014-02-21 MED ORDER — LOSARTAN POTASSIUM 50 MG PO TABS
50.0000 mg | ORAL_TABLET | Freq: Two times a day (BID) | ORAL | Status: DC
Start: 2014-02-21 — End: 2014-05-06

## 2014-02-21 NOTE — Progress Notes (Signed)
Patient ID: Torboy Sink, female   DOB: May 03, 1924, 80 y.o.   MRN: [id number]      SUBJECTIVE: Mrs. Kreher is an 80 year old female with a history of paroxysmal atrial fibrillation and atrial flutter as well as malignant HTN. She is on chronic Coumadin therapy. She has a history of normal LV function and nonobstructive coronary disease by cardiac catheterization 2005.  I previously tried her on amlodipine, chlorthalidone, and hydralzine for HTN, but she did not tolerate these medications. I tried Marisa Severin but it was too expensive for her. I subsequently started her on losartan.   She was hospitalized for chest pain at [facility] in September 2015 which was deemed atypical and likely due to costochondritis. Troponins were normal. CT angiography of the chest did not demonstrate any evidence of pulmonary embolus. Chest x-ray was also normal. ECG demonstrated normal sinus rhythm with LVH.   She was then hospitalized for an extensive CVA involving the left PCA territory in late October 2015, deemed ischemic in etiology, and without hemorrhagic transformation. INR 2.1 on admission. Echocardiogram showed normal LV systolic function, EF 78-93%, mild LVH, mild to moderate LAE, and moderate MR. Carotid Dopplers did not demonstrate any significant stenosis.  She had some mild chest pains yesterday. Her BP is elevated today. She says she has an infected toenail on her left foot which needs to be removed, but was told by another provider to not stop taking warfarin.     Review of Systems: As per "subjective", otherwise negative.  Allergies  Allergen Reactions  . Alprazolam     REACTION: sleepy  . Amlodipine Other (See Comments)    Edema feet & legs, nausea  . Atorvastatin     REACTION: myalgia  . Azithromycin     REACTION: rash  . Cephalexin     REACTION: rash  . Chlorthalidone     Nausea, loss of appetite, insomnia, generally did not feel well  . Crestor [Rosuvastatin]   .  Epinephrine     REACTION: B/P,chest pain  . Inderal [Propranolol]   . Norvasc [Amlodipine Besylate]   . Penicillins     REACTION: rash  . Polysporin [Bacitracin-Polymyxin B]   . Premarin [Conjugated Estrogens]     rash  . Propranolol Hcl     REACTION: rash  . Quinapril Hcl     REACTION: rash  . Statins     REACTION: myalgia  . Sulfamethoxazole-Trimethoprim   . Zetia [Ezetimibe]     Current Outpatient Prescriptions  Medication Sig Dispense Refill  . Cholecalciferol (VITAMIN D3) 400 UNITS tablet Take 400 Units by mouth daily.     . cyanocobalamin 500 MCG tablet Take 500 mcg by mouth every 30 (thirty) days.     . diazepam (VALIUM) 5 MG tablet Take 5 mg by mouth daily as needed. Anxiety.    Marland Kitchen diltiazem (CARDIZEM CD) 180 MG 24 hr capsule Take 1 capsule (180 mg total) by mouth daily. 30 capsule 6  . losartan (COZAAR) 50 MG tablet Take 1 tablet (50 mg total) by mouth 2 (two) times daily. 60 tablet 6  . nitroGLYCERIN (NITROSTAT) 0.4 MG SL tablet Place 1 tablet (0.4 mg total) under the tongue every 5 (five) minutes as needed for chest pain. 25 tablet 3  . warfarin (COUMADIN) 2.5 MG tablet Take 2 tablets (5 mg total) by mouth daily at 6 PM. 60 tablet 3   No current facility-administered medications for this visit.    Past Medical History  Diagnosis Date  .  PAF (paroxysmal atrial fibrillation)   . HTN (hypertension)   . DM2 (diabetes mellitus, type 2)   . MR (mitral regurgitation)     mild  . CAD (coronary artery disease)     nonobstructive  . Dyslipidemia   . Drug intolerance     intolerant to numerous statins and Zetia  . Anxiety disorder     generalized  . Arthritis   . Breast cancer 1988    s/p right lumpectomy  . Colon cancer 1991    s/p right hemicolectom    Past Surgical History  Procedure Laterality Date  . Chronic coumadin      CHAD2:3 (medical hx)  . Colon surgery  1991    rt  . Laproscopic cholecystectomy    . Tonsillectomy    . Cataract extraction w/  intraocular lens  implant, bilateral      no cataract extraction   . Breast lumpectomy  1988    rt  . Appendectomy    . Tonsillectomy    . Cholecystectomy    . Cardiac catheterization  2005    [place]: nonobstructive coronary disease, normal LV function    History   Social History  . Marital Status: Married    Spouse Name: N/A    Number of Children: N/A  . Years of Education: N/A   Occupational History  . Not on file.   Social History Main Topics  . Smoking status: Never Smoker   . Smokeless tobacco: Never Used     Comment: tobacco use - no  . Alcohol Use: No  . Drug Use: No  . Sexual Activity: Not on file   Other Topics Concern  . Not on file   Social History Narrative  . No narrative on file     BP 171/76, 166/83  Pulse 73 SpO2 97%  Weight 110 lb (49.896 kg) Height 4\' 9"  (1.448 m)    PHYSICAL EXAM General: NAD HEENT: Normal. Neck: No JVD, no thyromegaly. Lungs: Clear to auscultation bilaterally with normal respiratory effort. CV: Nondisplaced PMI.  Regular rate and rhythm, normal S1/S2, no S3/S4, no murmur. No pretibial or periankle edema.    Abdomen: Soft, nontender, no hepatosplenomegaly, no distention.  Neurologic: Alert and oriented x 3.  Psych: Normal affect. Skin: Normal. Musculoskeletal: No gross deformities. Extremities: No clubbing or cyanosis.   ECG: Most recent ECG reviewed.      ASSESSMENT AND PLAN: 1. Atrial fibrillation: Currently in a regular rhythm and tolerating warfarin. No changes in therapy. On long-acting diltiazem 180 mg daily. Warfarin can be held 3 days prior to toenail removal if need be, as her CVA appears to have been ischemic and not thrombotic. 2. Malignant HTN: Elevated today. I had previously increased losartan to 50 mg bid, but she is now only taking 50 mg daily. Intolerant to several antihypertensives in the past. I will increase losartan to 50 mg bid. 3. CVA: BP control of optimal importance. On warfarin for atrial  fibrillation.  Dispo: f/u 6 months.  Kate Sable, M.D., F.A.C.C.

## 2014-02-21 NOTE — Patient Instructions (Signed)
   Increase Losartan to 50mg  twice a day   Continue all other medications.   Okay to hold your Warfarin 3 days prior to any procedures (podiatry).  Resume as soon as possible afterwards. Your physician wants you to follow up in: 6 months.  You will receive a reminder letter in the mail one-two months in advance.  If you don't receive a letter, please call our office to schedule the follow up appointment

## 2014-02-22 ENCOUNTER — Telehealth: Payer: Self-pay | Admitting: Cardiovascular Disease

## 2014-02-22 NOTE — Telephone Encounter (Signed)
Returned call to patient - stated she had concerns about stopping her Warfarin in light of her recent stroke.  Explained to her that Dr. Bronson Ing felt it was safe for you to do so based on type of stroke she had.  Explained to patient that message will be sent to provider for any further explanations.

## 2014-02-22 NOTE — Telephone Encounter (Signed)
Would like a return call.

## 2014-02-23 DIAGNOSIS — Z961 Presence of intraocular lens: Secondary | ICD-10-CM | POA: Diagnosis not present

## 2014-02-23 DIAGNOSIS — E119 Type 2 diabetes mellitus without complications: Secondary | ICD-10-CM | POA: Diagnosis not present

## 2014-02-23 NOTE — Telephone Encounter (Signed)
See my office note. CVA was ischemic and thrombotic in etiology. OK to hold warfarin for 3 days prior to toenail removal if this is what the patient wishes. It can be resumed promptly thereafter.

## 2014-02-28 DIAGNOSIS — L600 Ingrowing nail: Secondary | ICD-10-CM | POA: Diagnosis not present

## 2014-02-28 DIAGNOSIS — L03032 Cellulitis of left toe: Secondary | ICD-10-CM | POA: Diagnosis not present

## 2014-02-28 DIAGNOSIS — M79675 Pain in left toe(s): Secondary | ICD-10-CM | POA: Diagnosis not present

## 2014-03-01 NOTE — Telephone Encounter (Signed)
Patient informed. Patient said she saw the podiatrist yesterday and was told that she didn't need to have the toenail removal because it looked better.

## 2014-03-10 DIAGNOSIS — M1990 Unspecified osteoarthritis, unspecified site: Secondary | ICD-10-CM | POA: Diagnosis not present

## 2014-03-11 ENCOUNTER — Encounter: Payer: Self-pay | Admitting: *Deleted

## 2014-03-22 ENCOUNTER — Ambulatory Visit (INDEPENDENT_AMBULATORY_CARE_PROVIDER_SITE_OTHER): Payer: Medicare Other | Admitting: *Deleted

## 2014-03-22 DIAGNOSIS — Z8679 Personal history of other diseases of the circulatory system: Secondary | ICD-10-CM

## 2014-03-22 DIAGNOSIS — Z5181 Encounter for therapeutic drug level monitoring: Secondary | ICD-10-CM

## 2014-03-22 DIAGNOSIS — Z7901 Long term (current) use of anticoagulants: Secondary | ICD-10-CM

## 2014-03-22 LAB — POCT INR: INR: 2.5

## 2014-03-25 IMAGING — CT CT HEAD W/O CM
1 series · 16 of 30 positions shown, 20 images · non-contrast
Comparison: 11/29/2011 CT.  01/09/2010 MR.

CLINICAL DATA: Dizziness and numbness.  History colon cancer breast
cancer.  Diabetic the patient.

CT HEAD WITHOUT CONTRAST
TECHNIQUE: Contiguous axial images were obtained from the base of
the skull through the vertex without contrast.

[Series 2: headseq 4.8 h37s · axial · 0.43mm/px · z∈[+161,+298]mm · 16 of 30 slices shown, 20 images]
[im 2/30  brain]
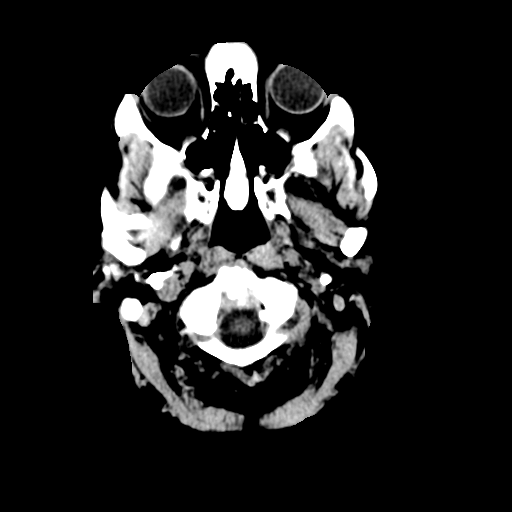
[im 2/30  bone]
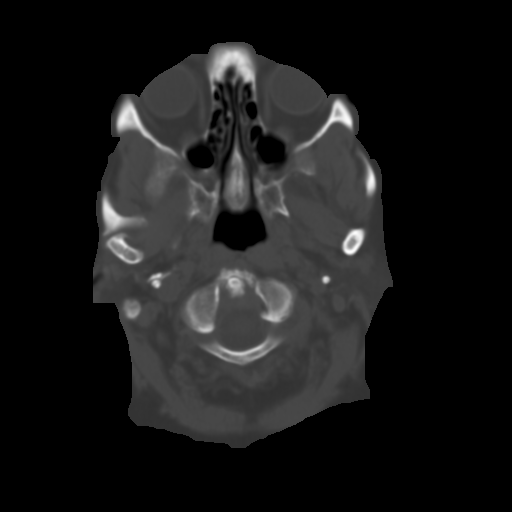
[im 4/30  brain]
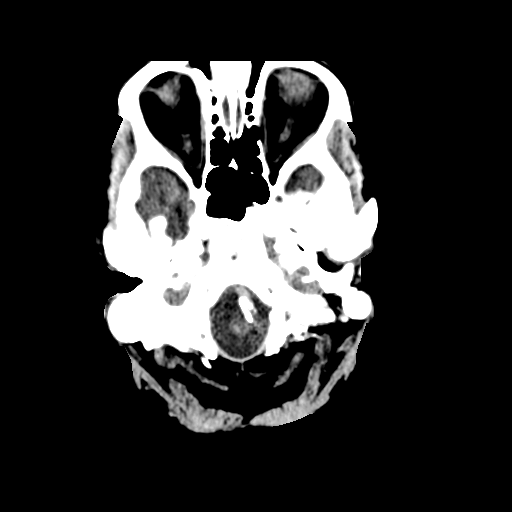
[im 6/30  brain]
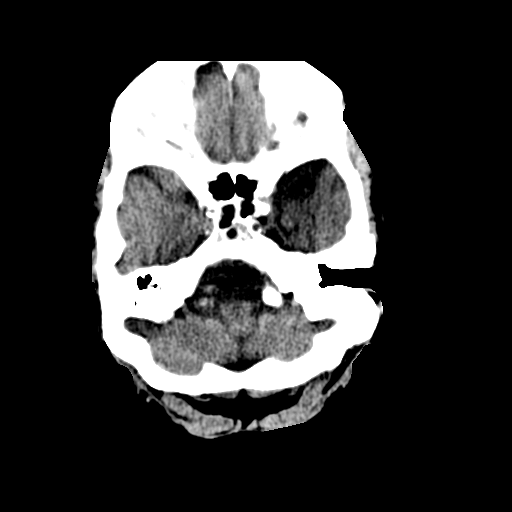
[im 8/30  brain]
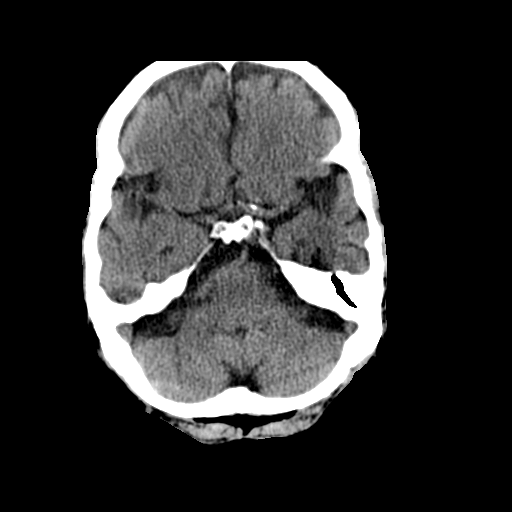
[im 9/30  brain]
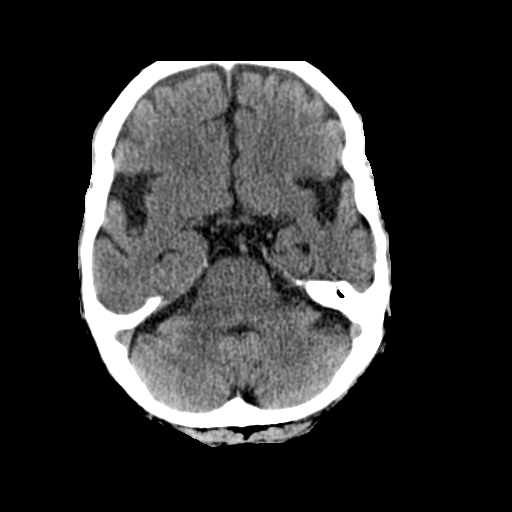
[im 9/30  bone]
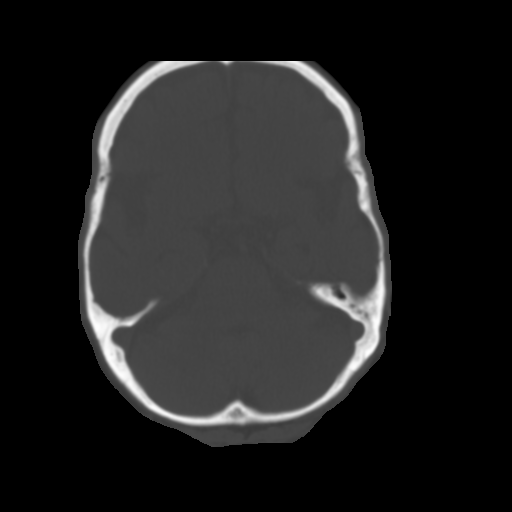
[im 11/30  brain]
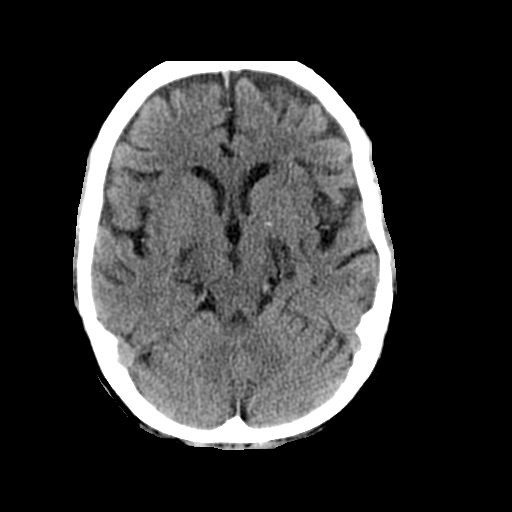
[im 13/30  brain]
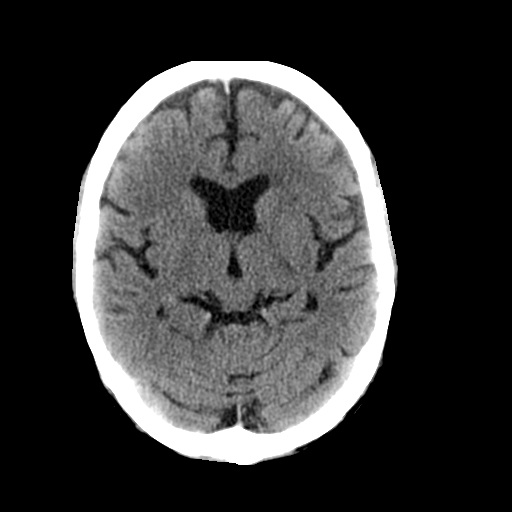
[im 15/30  brain]
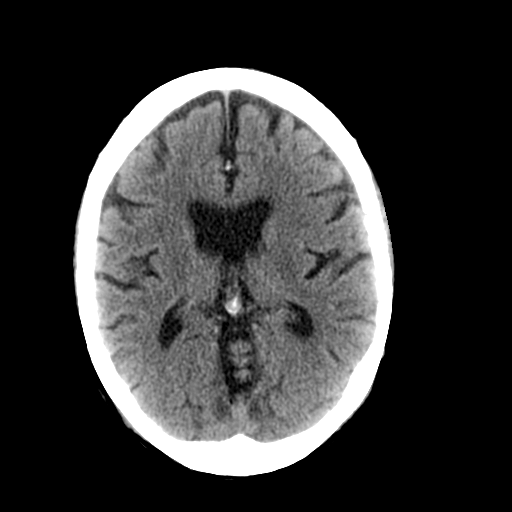
[im 16/30  brain]
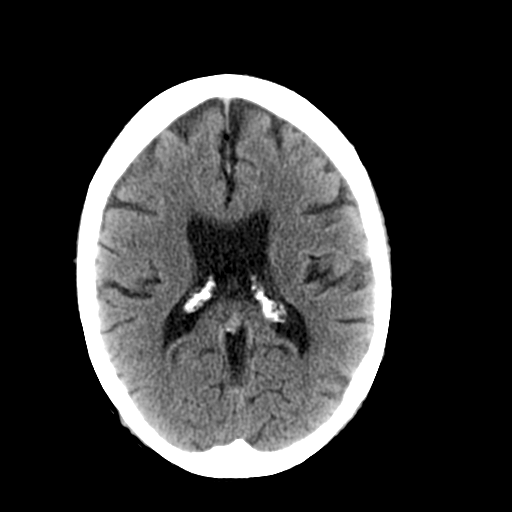
[im 16/30  bone]
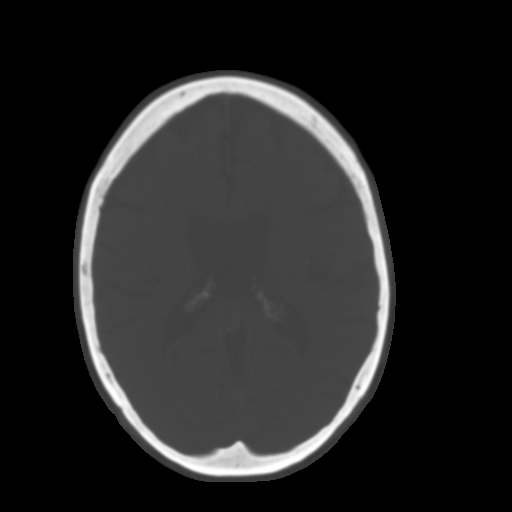
[im 18/30  brain]
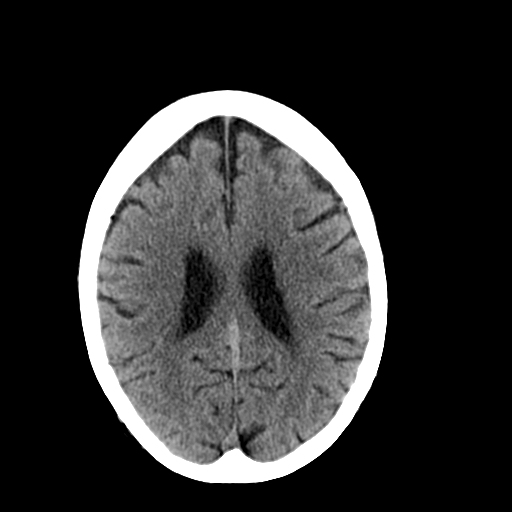
[im 20/30  brain]
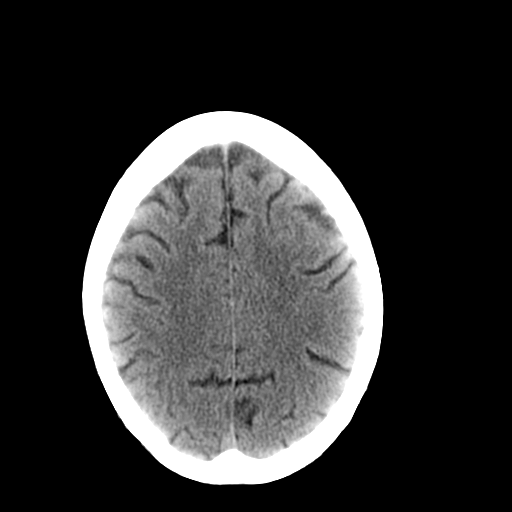
[im 22/30  brain]
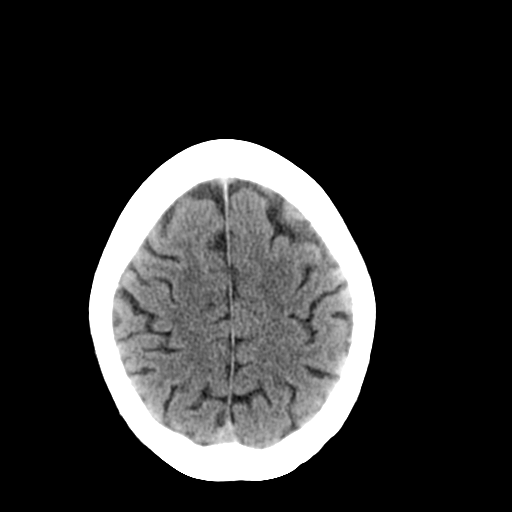
[im 23/30  brain]
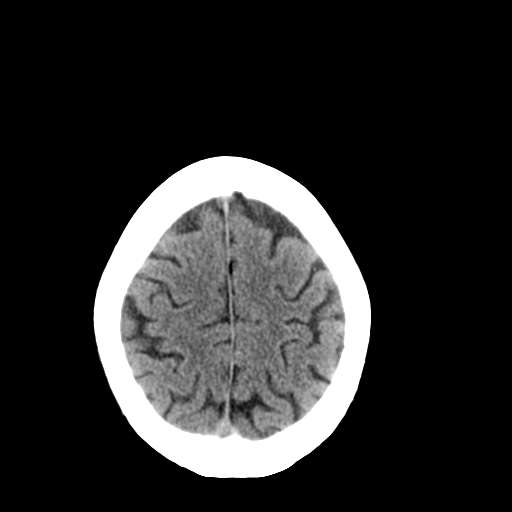
[im 23/30  bone]
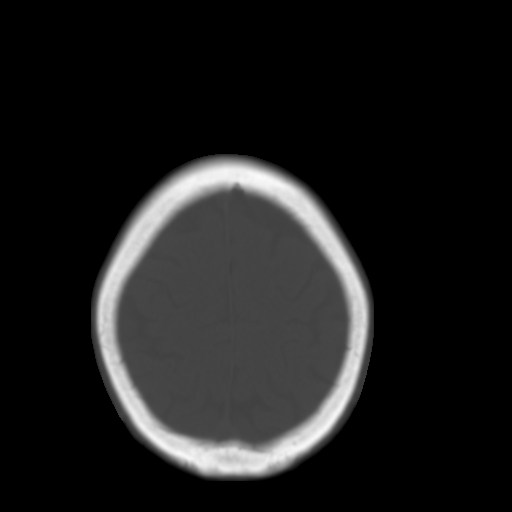
[im 25/30  brain]
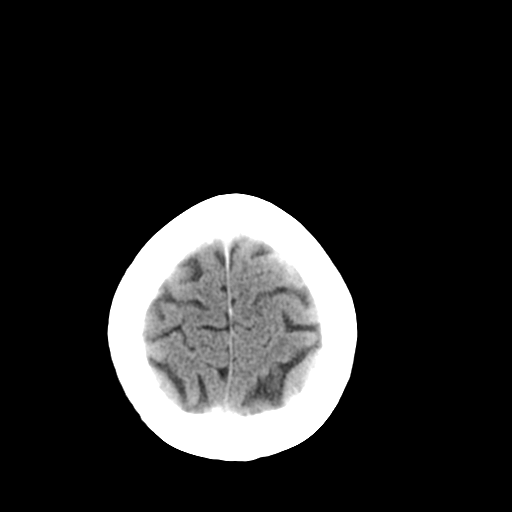
[im 27/30  brain]
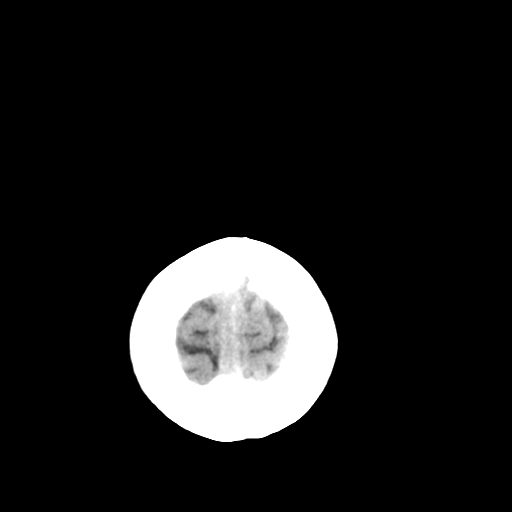
[im 29/30  brain]
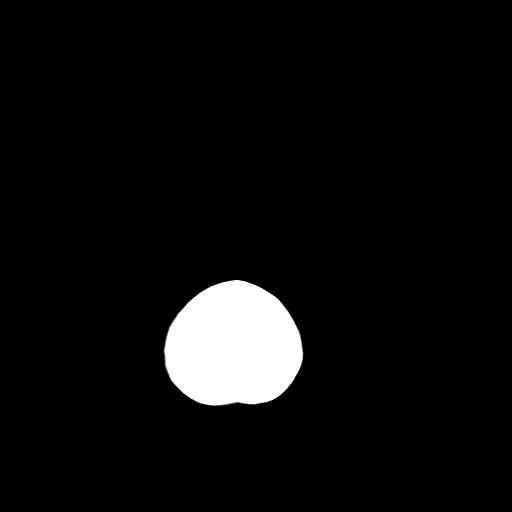

[16 of 30 positions shown; findings below may reference images not displayed]

FINDINGS: No intracranial hemorrhage.

Small vessel disease type changes without CT evidence of large
acute infarct.

No intracranial mass lesion detected on this unenhanced exam..

Mild global age related atrophy without hydrocephalus.

Vascular calcifications.

Visualized sinuses, middle ear cavities and mastoid air cells are
clear.
IMPRESSION: No intracranial hemorrhage or CT evidence of large acute infarct.

## 2014-04-19 ENCOUNTER — Ambulatory Visit (INDEPENDENT_AMBULATORY_CARE_PROVIDER_SITE_OTHER): Payer: Medicare Other | Admitting: *Deleted

## 2014-04-19 DIAGNOSIS — Z5181 Encounter for therapeutic drug level monitoring: Secondary | ICD-10-CM | POA: Diagnosis not present

## 2014-04-19 DIAGNOSIS — Z8679 Personal history of other diseases of the circulatory system: Secondary | ICD-10-CM | POA: Diagnosis not present

## 2014-04-19 DIAGNOSIS — Z7901 Long term (current) use of anticoagulants: Secondary | ICD-10-CM

## 2014-04-19 LAB — POCT INR: INR: 2.7

## 2014-05-06 ENCOUNTER — Encounter: Payer: Self-pay | Admitting: Cardiovascular Disease

## 2014-05-06 ENCOUNTER — Ambulatory Visit (INDEPENDENT_AMBULATORY_CARE_PROVIDER_SITE_OTHER): Payer: Medicare Other | Admitting: Cardiovascular Disease

## 2014-05-06 VITALS — BP 186/76 | HR 54 | Wt 112.0 lb

## 2014-05-06 DIAGNOSIS — I10 Essential (primary) hypertension: Secondary | ICD-10-CM

## 2014-05-06 DIAGNOSIS — I639 Cerebral infarction, unspecified: Secondary | ICD-10-CM

## 2014-05-06 DIAGNOSIS — I480 Paroxysmal atrial fibrillation: Secondary | ICD-10-CM | POA: Diagnosis not present

## 2014-05-06 MED ORDER — LOSARTAN POTASSIUM 100 MG PO TABS: 100.0000 mg | ORAL_TABLET | Freq: Every day | ORAL | Status: DC

## 2014-05-06 NOTE — Progress Notes (Signed)
Patient ID: [name], female   DOB: 1924-09-27, 81 y.o.   MRN: [id number]      SUBJECTIVE: Judith Chung is an 81 year old female with a history of paroxysmal atrial fibrillation and atrial flutter as well as malignant HTN. She is on chronic Coumadin therapy. She has a history of normal LV function and nonobstructive coronary disease by cardiac catheterization 2005.  I previously tried her on amlodipine, chlorthalidone, and hydralzine for HTN, but she did not tolerate these medications. I tried Marisa Severin but it was too expensive for her. I subsequently started her on losartan.   She was hospitalized for chest pain at [facility] in September 2015 which was deemed atypical and likely due to costochondritis. Troponins were normal. CT angiography of the chest did not demonstrate any evidence of pulmonary embolus. Chest x-ray was also normal. ECG demonstrated normal sinus rhythm with LVH.   She was then hospitalized for an extensive CVA involving the left PCA territory in late October 2015, deemed ischemic in etiology, and without hemorrhagic transformation. INR 2.1 on admission. Echocardiogram showed normal LV systolic function, EF 96-78%, mild LVH, mild to moderate LAE, and moderate MR. Carotid Dopplers did not demonstrate any significant stenosis.  She denies chest pain, palpitations, leg swelling, and shortness of breath. She is here with her husband. He says that she has become more forgetful since her stroke. She admits that she is not regularly taking her evening dose of losartan.     Review of Systems: As per "subjective", otherwise negative.  Allergies  Allergen Reactions  . Alprazolam     REACTION: sleepy  . Amlodipine Other (See Comments)    Edema feet & legs, nausea  . Atorvastatin     REACTION: myalgia  . Azithromycin     REACTION: rash  . Cephalexin     REACTION: rash  . Chlorthalidone     Nausea, loss of appetite, insomnia, generally did not feel well  .  Crestor [Rosuvastatin]   . Epinephrine     REACTION: B/P,chest pain  . Inderal [Propranolol]   . Norvasc [Amlodipine Besylate]   . Penicillins     REACTION: rash  . Polysporin [Bacitracin-Polymyxin B]   . Premarin [Conjugated Estrogens]     rash  . Propranolol Hcl     REACTION: rash  . Quinapril Hcl     REACTION: rash  . Statins     REACTION: myalgia  . Sulfamethoxazole-Trimethoprim   . Zetia [Ezetimibe]     Current Outpatient Prescriptions  Medication Sig Dispense Refill  . Cholecalciferol (VITAMIN D3) 400 UNITS tablet Take 400 Units by mouth daily.     . cyanocobalamin 500 MCG tablet Take 500 mcg by mouth every 30 (thirty) days.     . diazepam (VALIUM) 5 MG tablet Take 5 mg by mouth daily as needed. Anxiety.    Marland Kitchen diltiazem (CARDIZEM CD) 180 MG 24 hr capsule Take 1 capsule (180 mg total) by mouth daily. 30 capsule 6  . losartan (COZAAR) 50 MG tablet Take 1 tablet (50 mg total) by mouth 2 (two) times daily.    . nitroGLYCERIN (NITROSTAT) 0.4 MG SL tablet Place 1 tablet (0.4 mg total) under the tongue every 5 (five) minutes as needed for chest pain. 25 tablet 3  . warfarin (COUMADIN) 2.5 MG tablet Take 2 tablets (5 mg total) by mouth daily at 6 PM. 60 tablet 3   No current facility-administered medications for this visit.    Past Medical History  Diagnosis Date  .  PAF (paroxysmal atrial fibrillation)   . HTN (hypertension)   . DM2 (diabetes mellitus, type 2)   . MR (mitral regurgitation)     mild  . CAD (coronary artery disease)     nonobstructive  . Dyslipidemia   . Drug intolerance     intolerant to numerous statins and Zetia  . Anxiety disorder     generalized  . Arthritis   . Breast cancer 1988    s/p right lumpectomy  . Colon cancer 1991    s/p right hemicolectom    Past Surgical History  Procedure Laterality Date  . Chronic coumadin      CHAD2:3 (medical hx)  . Colon surgery  1991    rt  . Laproscopic cholecystectomy    . Tonsillectomy    . Cataract  extraction w/ intraocular lens  implant, bilateral      no cataract extraction   . Breast lumpectomy  1988    rt  . Appendectomy    . Tonsillectomy    . Cholecystectomy    . Cardiac catheterization  2005    : nonobstructive coronary disease, normal LV function    History   Social History  . Marital Status: Married    Spouse Name: N/A  . Number of Children: N/A  . Years of Education: N/A   Occupational History  . Not on file.   Social History Main Topics  . Smoking status: Never Smoker   . Smokeless tobacco: Never Used     Comment: tobacco use - no  . Alcohol Use: No  . Drug Use: No  . Sexual Activity: Not on file   Other Topics Concern  . Not on file   Social History Narrative     Filed Vitals:   05/06/14 1103  Weight: 112 lb (50.803 kg)  SpO2: 99%   BP 188/67  Pulse 54  SpO2 99%   PHYSICAL EXAM General: NAD HEENT: Normal. Neck: No JVD, no thyromegaly. Lungs: Clear to auscultation bilaterally with normal respiratory effort. CV: Nondisplaced PMI.  Regular rate and rhythm, normal S1/S2, no S3/S4, no murmur. No pretibial or periankle edema.  No carotid bruit.  Normal pedal pulses.  Abdomen: Soft, nontender, no hepatosplenomegaly, no distention.  Neurologic: Alert and oriented x 3.  Psych: Normal affect. Skin: Normal. Musculoskeletal: Normal range of motion, no gross deformities. Extremities: No clubbing or cyanosis.   ECG: Most recent ECG reviewed.      ASSESSMENT AND PLAN: 1. Atrial fibrillation: Currently in a regular rhythm and tolerating warfarin. No changes in therapy. On long-acting diltiazem 180 mg daily.  2. Malignant HTN: Elevated today. I had previously increased losartan to 50 mg bid, but she has not been taking the evening dose regularly. I will change this to 100 mg q morning. Intolerant to several antihypertensives in the past. I will have her return for a nurse visit to have BP checked in 3 weeks. If it remains elevated, I would  consider starting clonidine 0.1 mg daily. 3. CVA: BP control of optimal importance. On warfarin for atrial fibrillation.  Dispo: f/u 6 months.  Kate Sable, M.D., F.A.C.C.

## 2014-05-06 NOTE — Patient Instructions (Addendum)
Your physician recommends that you schedule a follow-up appointment in: 6 months. You will receive a reminder letter in the mail in about 4 months reminding you to call and schedule your appointment. If you don't receive this letter, please contact our office. Your physician has recommended you make the following change in your medication:  Change losartan to 100 mg daily every morning. You may take (2) of your 50 mg daily in the morning until they are finished. Continue all other medications the same. Please come for a blood pressure check only in 3 weeks with the nurse.

## 2014-05-17 ENCOUNTER — Ambulatory Visit (INDEPENDENT_AMBULATORY_CARE_PROVIDER_SITE_OTHER): Payer: Medicare Other | Admitting: *Deleted

## 2014-05-17 DIAGNOSIS — Z8679 Personal history of other diseases of the circulatory system: Secondary | ICD-10-CM | POA: Diagnosis not present

## 2014-05-17 DIAGNOSIS — Z7901 Long term (current) use of anticoagulants: Secondary | ICD-10-CM

## 2014-05-17 DIAGNOSIS — Z5181 Encounter for therapeutic drug level monitoring: Secondary | ICD-10-CM | POA: Diagnosis not present

## 2014-05-17 LAB — POCT INR: INR: 2

## 2014-05-31 ENCOUNTER — Ambulatory Visit (INDEPENDENT_AMBULATORY_CARE_PROVIDER_SITE_OTHER): Payer: Medicare Other | Admitting: *Deleted

## 2014-05-31 VITALS — BP 214/86 | HR 57

## 2014-05-31 DIAGNOSIS — I10 Essential (primary) hypertension: Secondary | ICD-10-CM

## 2014-05-31 NOTE — Progress Notes (Signed)
Patient in office this morning for BP check.  Patient stated that she has not taken her morning medications yet.    BP at 9:25 am -  Left - 204/80  57  98%  Right - 214/86    After sitting 10 minutes (9:40 am) - no change (had not had meds) - 214/86  Advised to go straight home to take medication now.

## 2014-06-02 ENCOUNTER — Telehealth: Payer: Self-pay | Admitting: Cardiovascular Disease

## 2014-06-02 MED ORDER — CLONIDINE HCL 0.1 MG PO TABS: 0.1000 mg | ORAL_TABLET | Freq: Every day | ORAL | Status: DC

## 2014-06-02 NOTE — Telephone Encounter (Signed)
Patient & husband notified.  Will send new rx to [facility].  Advised to continue to monitor readings & bring to office for MD review in 2-3 weeks.

## 2014-06-02 NOTE — Telephone Encounter (Signed)
If she is taking losartan 100 mg q morning, you can start clonidine 0.1 mg daily as well.   Dr. Bronson Ing

## 2014-06-02 NOTE — Telephone Encounter (Signed)
Judith Chung was calling to see if Dr. Bronson Ing had responded to her BP

## 2014-06-08 ENCOUNTER — Telehealth: Payer: Self-pay | Admitting: Cardiovascular Disease

## 2014-06-08 NOTE — Telephone Encounter (Signed)
Judith Chung called today stating that since taking the cloNIDine (CATAPRES) 0.1 MG tablet  She is wanting to sleep. States she is very tired.  Wants to know if she should continue taking the medication

## 2014-06-08 NOTE — Telephone Encounter (Signed)
Will forward to Dr. Koneswaran  

## 2014-06-08 NOTE — Telephone Encounter (Signed)
Have her take it at night.

## 2014-06-09 NOTE — Telephone Encounter (Signed)
Pt made aware, will call back if any further problems.

## 2014-06-22 DIAGNOSIS — D519 Vitamin B12 deficiency anemia, unspecified: Secondary | ICD-10-CM | POA: Diagnosis not present

## 2014-06-22 DIAGNOSIS — E876 Hypokalemia: Secondary | ICD-10-CM | POA: Diagnosis not present

## 2014-06-22 DIAGNOSIS — M1990 Unspecified osteoarthritis, unspecified site: Secondary | ICD-10-CM | POA: Diagnosis not present

## 2014-06-22 DIAGNOSIS — G619 Inflammatory polyneuropathy, unspecified: Secondary | ICD-10-CM | POA: Diagnosis not present

## 2014-06-22 DIAGNOSIS — R634 Abnormal weight loss: Secondary | ICD-10-CM | POA: Diagnosis not present

## 2014-06-22 DIAGNOSIS — E119 Type 2 diabetes mellitus without complications: Secondary | ICD-10-CM | POA: Diagnosis not present

## 2014-06-22 DIAGNOSIS — I4891 Unspecified atrial fibrillation: Secondary | ICD-10-CM | POA: Diagnosis not present

## 2014-06-22 DIAGNOSIS — E780 Pure hypercholesterolemia: Secondary | ICD-10-CM | POA: Diagnosis not present

## 2014-06-28 ENCOUNTER — Ambulatory Visit (INDEPENDENT_AMBULATORY_CARE_PROVIDER_SITE_OTHER): Payer: Medicare Other | Admitting: *Deleted

## 2014-06-28 ENCOUNTER — Telehealth: Payer: Self-pay | Admitting: Cardiovascular Disease

## 2014-06-28 DIAGNOSIS — Z7901 Long term (current) use of anticoagulants: Secondary | ICD-10-CM

## 2014-06-28 DIAGNOSIS — Z5181 Encounter for therapeutic drug level monitoring: Secondary | ICD-10-CM | POA: Diagnosis not present

## 2014-06-28 DIAGNOSIS — Z8679 Personal history of other diseases of the circulatory system: Secondary | ICD-10-CM | POA: Diagnosis not present

## 2014-06-28 LAB — POCT INR: INR: 1.9

## 2014-06-28 NOTE — Telephone Encounter (Signed)
BP readings are on Dr. Bronson Ing desk for review.

## 2014-06-28 NOTE — Telephone Encounter (Signed)
Patient walk in saw [name]  Still wanting to know about her BP being high

## 2014-06-30 ENCOUNTER — Telehealth: Payer: Self-pay | Admitting: Cardiovascular Disease

## 2014-06-30 NOTE — Telephone Encounter (Signed)
Patient has a bruise on her arm and would like to be seen or nurse call her back

## 2014-07-01 MED ORDER — CLONIDINE HCL 0.1 MG PO TABS: 0.2000 mg | ORAL_TABLET | Freq: Every evening | ORAL | Status: DC

## 2014-07-01 NOTE — Telephone Encounter (Signed)
Discussed with patient, stated she had 2 bruises on her arm.  Informed her that her last INR was on 06/28/14 & she was 1.9.  Patient does not remember bumping on anything.  Advised her to continue to monitor for now as her skin is thinner & a little bump will look like a bigger one while taking blood thinners.  If worsening, see PMD.

## 2014-07-01 NOTE — Telephone Encounter (Signed)
Patient notified

## 2014-07-01 NOTE — Telephone Encounter (Signed)
Herminio Commons, MD  SentLucina Mellow 6:12 PM   To: Laurine Blazer, LPN     Message    BP log reviewed. Have her increase clonidine to 0.2 mg every evening.

## 2014-07-07 DIAGNOSIS — Z1231 Encounter for screening mammogram for malignant neoplasm of breast: Secondary | ICD-10-CM | POA: Diagnosis not present

## 2014-07-12 ENCOUNTER — Encounter (INDEPENDENT_AMBULATORY_CARE_PROVIDER_SITE_OTHER): Payer: Self-pay | Admitting: *Deleted

## 2014-07-12 ENCOUNTER — Encounter (INDEPENDENT_AMBULATORY_CARE_PROVIDER_SITE_OTHER): Payer: Self-pay

## 2014-07-12 ENCOUNTER — Telehealth: Payer: Self-pay | Admitting: *Deleted

## 2014-07-12 MED ORDER — NIFEDIPINE ER OSMOTIC RELEASE 30 MG PO TB24: 30.0000 mg | ORAL_TABLET | Freq: Every day | ORAL | Status: DC

## 2014-07-12 NOTE — Telephone Encounter (Signed)
Pt c/o after taking clonidine she "doesn't feel right" upset stomach, anxious. No other symptoms. Would like to speak with nurse and provider who she is use to. Pt advised that nurse was in clinic and would call her back this afternoon, pt understood.

## 2014-07-12 NOTE — Telephone Encounter (Signed)
Patient notified.  Will send new rx to [facility].  She will call with update in 2-3 weeks on how doing.

## 2014-07-12 NOTE — Telephone Encounter (Signed)
She has been intolerant to several antihypertensives in the past, but has a h/o CVA and needs optimal BP control.  Can d/c clonidine and start Procardia-XL 30 mg daily.

## 2014-07-14 ENCOUNTER — Telehealth: Payer: Self-pay | Admitting: *Deleted

## 2014-07-14 DIAGNOSIS — K5900 Constipation, unspecified: Secondary | ICD-10-CM | POA: Diagnosis not present

## 2014-07-14 DIAGNOSIS — K641 Second degree hemorrhoids: Secondary | ICD-10-CM | POA: Diagnosis not present

## 2014-07-14 MED ORDER — NIFEDIPINE ER OSMOTIC RELEASE 30 MG PO TB24
30.0000 mg | ORAL_TABLET | ORAL | Status: DC
Start: 2014-07-14 — End: 2014-07-19

## 2014-07-14 NOTE — Telephone Encounter (Signed)
Message left on voicemail - had question about her new medication, didn't understand.  Attempted to return call - left message.

## 2014-07-14 NOTE — Telephone Encounter (Signed)
Patient walked into office - stated she had been out & had medication question.    Discussed with patient her concerns over starting the new medication (Procardia).  Stated that she read the side effects & was scared to begin.  Advised patient to at least try this medication since she is running out of options.  Very important to maintain optimal BP control due to history of stroke.  Informed her to take the Procardia in the morning as her pharmacist suggested.  If she starts feeling fatigue, tiredness - she can change to evening time.  This was the reason she was doing her Clonidine in the evening previously.  Patient verbalized understanding & will call office with any further concerns.

## 2014-07-18 ENCOUNTER — Telehealth: Payer: Self-pay | Admitting: Cardiovascular Disease

## 2014-07-18 NOTE — Telephone Encounter (Signed)
Left message to return call 

## 2014-07-19 MED ORDER — CLONIDINE HCL 0.3 MG PO TABS: 0.3000 mg | ORAL_TABLET | Freq: Every evening | ORAL | Status: DC

## 2014-07-19 NOTE — Telephone Encounter (Signed)
Error on below.    Per Dr. Bronson Ing - She appears not to be able to tolerate ANY meds, so this really limits our options. She has had a stroke and NEEDS BP control.   Would increase clonidine to 0.3 mg.

## 2014-07-19 NOTE — Telephone Encounter (Signed)
Would try nifedipine in the morning in that case.

## 2014-07-19 NOTE — Telephone Encounter (Signed)
Patient notified & verbalized understanding.  Will send new rx to [facility].  She will bring BP readings in for MD review in about 7-10 days.

## 2014-07-19 NOTE — Telephone Encounter (Signed)
Discussed with patient & husband yesterday.    07/14/14 - Thursday - patient walked into office & instructions were given regarding medication change.  Patient does not remember if she took this night or not.   07/15/14 - Friday - Took the new med (Nifedipine) as instructed. 5/21 & 22/16 - Saturday/Sunday - was unable to sleep either of these nights, was very bothersome.   07/18/14 - Monday - she took Clonidine this morning.  BP - 172/85  66   Stated she slept good without new medication.  Stated that she would rather put up with the nausea & other symptoms instead of no sleep.    Will need refill on Clonidine 0.1mg  - 2 tabs every evening - if we plan to resume this.  Patient & husband informed that message will be sent to Dr. Bronson Ing for further advice.

## 2014-07-20 DIAGNOSIS — K5900 Constipation, unspecified: Secondary | ICD-10-CM | POA: Diagnosis not present

## 2014-07-20 DIAGNOSIS — K641 Second degree hemorrhoids: Secondary | ICD-10-CM | POA: Diagnosis not present

## 2014-07-22 DIAGNOSIS — K5900 Constipation, unspecified: Secondary | ICD-10-CM | POA: Diagnosis not present

## 2014-07-22 DIAGNOSIS — K641 Second degree hemorrhoids: Secondary | ICD-10-CM | POA: Diagnosis not present

## 2014-07-26 ENCOUNTER — Telehealth: Payer: Self-pay | Admitting: *Deleted

## 2014-07-26 ENCOUNTER — Ambulatory Visit (INDEPENDENT_AMBULATORY_CARE_PROVIDER_SITE_OTHER): Payer: Medicare Other | Admitting: *Deleted

## 2014-07-26 DIAGNOSIS — Z7901 Long term (current) use of anticoagulants: Secondary | ICD-10-CM | POA: Diagnosis not present

## 2014-07-26 DIAGNOSIS — Z5181 Encounter for therapeutic drug level monitoring: Secondary | ICD-10-CM | POA: Diagnosis not present

## 2014-07-26 DIAGNOSIS — Z8679 Personal history of other diseases of the circulatory system: Secondary | ICD-10-CM | POA: Diagnosis not present

## 2014-07-26 LAB — POCT INR: INR: 2.9

## 2014-07-26 NOTE — Telephone Encounter (Signed)
Pt needs to schedule a routine colonoscopy.  Wanted to know if she needs to hold coumadin.  Told her most GI MD's hold coumadin 3-5 days but Dr Wylie Hail office would let her know when she schedules appt.  Will give her further instructions at that time.

## 2014-07-26 NOTE — Telephone Encounter (Signed)
Patient in office today for INR check with Judith Chung.  Dropped off BP readings since beginning Clonidine 0.3 every evening.    07/19/14 - 172/84  66 07/20/14 - 149/72  52 07/21/14 - 162/74  52 07/22/14 - 159/79  58 07/23/14 - 163/76  54 07/24/14 - 180/79  52 07/25/14 - 172/78  58 07/26/14 - 174/77  50  Forwarding to provider for advice.

## 2014-07-26 NOTE — Telephone Encounter (Signed)
Judith Chung called stating that she was confused about her medications. States that [name] told her to stop taking one of Her medications.

## 2014-07-27 MED ORDER — CLONIDINE HCL 0.1 MG PO TABS: 0.1000 mg | ORAL_TABLET | Freq: Every morning | ORAL | Status: DC

## 2014-07-27 NOTE — Telephone Encounter (Signed)
Patient notified.  Will send new rx to [facility].  Advised to call office if BP not improving.  Patient verbalized understanding.

## 2014-07-27 NOTE — Telephone Encounter (Signed)
If she can tolerate the minimum of 0.1 mg every morning, I would add this to her daily regimen, giving her a daily total of 0.4 mg.

## 2014-08-11 ENCOUNTER — Telehealth: Payer: Self-pay | Admitting: *Deleted

## 2014-08-11 DIAGNOSIS — R739 Hyperglycemia, unspecified: Secondary | ICD-10-CM | POA: Diagnosis not present

## 2014-08-11 DIAGNOSIS — E119 Type 2 diabetes mellitus without complications: Secondary | ICD-10-CM | POA: Diagnosis not present

## 2014-08-11 NOTE — Telephone Encounter (Signed)
Follow up call placed to patient to check on recent medication addition.  Spoke with husband Judith Chung) & stated she is still having some nausea with Clonidine.  Suggested he bring BP readings to next INR check on 08/23/14.  Also, informed him that if she feels like she needs to be seen sooner, we can see her sooner than her September time frame.  Husband verbalized understanding.

## 2014-08-16 ENCOUNTER — Telehealth: Payer: Self-pay | Admitting: Cardiovascular Disease

## 2014-08-16 NOTE — Telephone Encounter (Signed)
Wants to know about her medication and if she can change the way she takes it

## 2014-08-17 ENCOUNTER — Other Ambulatory Visit: Payer: Self-pay | Admitting: Cardiovascular Disease

## 2014-08-17 ENCOUNTER — Other Ambulatory Visit: Payer: Self-pay | Admitting: *Deleted

## 2014-08-17 MED ORDER — WARFARIN SODIUM 2.5 MG PO TABS: 5.0000 mg | ORAL_TABLET | Freq: Every day | ORAL | Status: DC

## 2014-08-17 NOTE — Telephone Encounter (Signed)
Patient wanted to know if she could stop the morning dose of clonidine 0.1 mg and continue the 0.3 mg at bedtime. Nurse advised patient that Dr. Bronson Ing wants her on both doses to better control her blood pressure and she should continue taking it the way its prescribed. Patient verbalized understanding of plan.

## 2014-08-24 DIAGNOSIS — R739 Hyperglycemia, unspecified: Secondary | ICD-10-CM | POA: Diagnosis not present

## 2014-08-24 DIAGNOSIS — E119 Type 2 diabetes mellitus without complications: Secondary | ICD-10-CM | POA: Diagnosis not present

## 2014-08-25 ENCOUNTER — Ambulatory Visit (INDEPENDENT_AMBULATORY_CARE_PROVIDER_SITE_OTHER): Payer: Medicare Other | Admitting: *Deleted

## 2014-08-25 DIAGNOSIS — Z7901 Long term (current) use of anticoagulants: Secondary | ICD-10-CM

## 2014-08-25 DIAGNOSIS — Z8679 Personal history of other diseases of the circulatory system: Secondary | ICD-10-CM | POA: Diagnosis not present

## 2014-08-25 DIAGNOSIS — Z5181 Encounter for therapeutic drug level monitoring: Secondary | ICD-10-CM | POA: Diagnosis not present

## 2014-08-25 LAB — POCT INR: INR: 2.3

## 2014-09-12 DIAGNOSIS — K649 Unspecified hemorrhoids: Secondary | ICD-10-CM | POA: Diagnosis not present

## 2014-09-22 ENCOUNTER — Ambulatory Visit (INDEPENDENT_AMBULATORY_CARE_PROVIDER_SITE_OTHER): Payer: Medicare Other | Admitting: *Deleted

## 2014-09-22 DIAGNOSIS — Z8679 Personal history of other diseases of the circulatory system: Secondary | ICD-10-CM

## 2014-09-22 DIAGNOSIS — Z5181 Encounter for therapeutic drug level monitoring: Secondary | ICD-10-CM | POA: Diagnosis not present

## 2014-09-22 DIAGNOSIS — Z7901 Long term (current) use of anticoagulants: Secondary | ICD-10-CM | POA: Diagnosis not present

## 2014-09-22 LAB — POCT INR: INR: 2.5

## 2014-10-12 DIAGNOSIS — M25571 Pain in right ankle and joints of right foot: Secondary | ICD-10-CM | POA: Diagnosis not present

## 2014-10-20 ENCOUNTER — Ambulatory Visit (INDEPENDENT_AMBULATORY_CARE_PROVIDER_SITE_OTHER): Payer: Medicare Other | Admitting: *Deleted

## 2014-10-20 DIAGNOSIS — Z5181 Encounter for therapeutic drug level monitoring: Secondary | ICD-10-CM

## 2014-10-20 DIAGNOSIS — Z7901 Long term (current) use of anticoagulants: Secondary | ICD-10-CM | POA: Diagnosis not present

## 2014-10-20 DIAGNOSIS — Z8679 Personal history of other diseases of the circulatory system: Secondary | ICD-10-CM | POA: Diagnosis not present

## 2014-10-20 LAB — POCT INR: INR: 2.6

## 2014-10-27 ENCOUNTER — Ambulatory Visit (INDEPENDENT_AMBULATORY_CARE_PROVIDER_SITE_OTHER): Payer: Medicare Other | Admitting: Cardiovascular Disease

## 2014-10-27 ENCOUNTER — Encounter: Payer: Self-pay | Admitting: Cardiovascular Disease

## 2014-10-27 VITALS — BP 118/68 | HR 62 | Ht 60.0 in | Wt 104.6 lb

## 2014-10-27 DIAGNOSIS — I480 Paroxysmal atrial fibrillation: Secondary | ICD-10-CM

## 2014-10-27 DIAGNOSIS — I639 Cerebral infarction, unspecified: Secondary | ICD-10-CM | POA: Diagnosis not present

## 2014-10-27 DIAGNOSIS — I10 Essential (primary) hypertension: Secondary | ICD-10-CM

## 2014-10-27 NOTE — Patient Instructions (Signed)

## 2014-10-27 NOTE — Progress Notes (Signed)
Patient ID: Judith Chung, female   DOB: 10-16-1924, 82 y.o.   MRN: [id number]      SUBJECTIVE: Mrs. Massimino is an 81 year old female with a history of paroxysmal atrial fibrillation and atrial flutter as well as malignant HTN. She is on chronic Coumadin therapy. She has a history of normal LV function and nonobstructive coronary disease by cardiac catheterization 2005.  I previously tried her on amlodipine, chlorthalidone, and hydralzine for HTN, but she did not tolerate these medications. I tried Marisa Severin but it was too expensive for her. I subsequently started her on losartan.   She was hospitalized for chest pain at [facility] in September 2015 which was deemed atypical and likely due to costochondritis. Troponins were normal. CT angiography of the chest did not demonstrate any evidence of pulmonary embolus. Chest x-ray was also normal. ECG demonstrated normal sinus rhythm with LVH.   She was then hospitalized for an extensive CVA involving the left PCA territory in late October 2015, deemed ischemic in etiology, and without hemorrhagic transformation. INR 2.1 on admission. Echocardiogram showed normal LV systolic function, EF 43-15%, mild LVH, mild to moderate LAE, and moderate MR. Carotid Dopplers did not demonstrate any significant stenosis.  She is feeling very well and denies chest pain, palpitations, leg swelling, and shortness of breath.   ECG performed in the office today demonstrates sinus rhythm with inferior T-wave inversions in leads III and aVF not previously seen by ECG and 2015, with a nonspecific T wave abnormality in leads V3 through V6.   Review of Systems: As per "subjective", otherwise negative.  Allergies  Allergen Reactions  . Alprazolam     REACTION: sleepy  . Amlodipine Other (See Comments)    Edema feet & legs, nausea  . Atorvastatin     REACTION: myalgia  . Azithromycin     REACTION: rash  . Cephalexin     REACTION: rash  . Chlorthalidone    Nausea, loss of appetite, insomnia, generally did not feel well  . Crestor [Rosuvastatin]   . Epinephrine     REACTION: B/P,chest pain  . Inderal [Propranolol]   . Norvasc [Amlodipine Besylate]   . Penicillins     REACTION: rash  . Polysporin [Bacitracin-Polymyxin B]   . Premarin [Conjugated Estrogens]     rash  . Propranolol Hcl     REACTION: rash  . Quinapril Hcl     REACTION: rash  . Statins     REACTION: myalgia  . Sulfamethoxazole-Trimethoprim   . Zetia [Ezetimibe]     Current Outpatient Prescriptions  Medication Sig Dispense Refill  . CARTIA XT 180 MG 24 hr capsule TAKE ONE CAPSULE BY MOUTH ONCE DAILY 30 capsule 3  . Cholecalciferol (VITAMIN D3) 400 UNITS tablet Take 400 Units by mouth daily.     . cloNIDine (CATAPRES) 0.1 MG tablet Take 1 tablet (0.1 mg total) by mouth every morning. 30 tablet 6  . cloNIDine (CATAPRES) 0.3 MG tablet Take 1 tablet (0.3 mg total) by mouth every evening. 30 tablet 6  . cyanocobalamin 500 MCG tablet Take 500 mcg by mouth every 30 (thirty) days.     . diazepam (VALIUM) 5 MG tablet Take 5 mg by mouth daily as needed. Anxiety.    Marland Kitchen losartan (COZAAR) 100 MG tablet Take 1 tablet (100 mg total) by mouth daily. 30 tablet 6  . nitroGLYCERIN (NITROSTAT) 0.4 MG SL tablet Place 1 tablet (0.4 mg total) under the tongue every 5 (five) minutes as needed for chest pain.  25 tablet 3  . warfarin (COUMADIN) 2.5 MG tablet Take 2 tablets (5 mg total) by mouth daily at 6 PM. 60 tablet 3   No current facility-administered medications for this visit.    Past Medical History  Diagnosis Date  . PAF (paroxysmal atrial fibrillation)   . HTN (hypertension)   . DM2 (diabetes mellitus, type 2)   . MR (mitral regurgitation)     mild  . CAD (coronary artery disease)     nonobstructive  . Dyslipidemia   . Drug intolerance     intolerant to numerous statins and Zetia  . Anxiety disorder     generalized  . Arthritis   . Breast cancer 1988    s/p right lumpectomy    . Colon cancer 1991    s/p right hemicolectom    Past Surgical History  Procedure Laterality Date  . Chronic coumadin      CHAD2:3 (medical hx)  . Colon surgery  1991    rt  . Laproscopic cholecystectomy    . Tonsillectomy    . Cataract extraction w/ intraocular lens  implant, bilateral      no cataract extraction   . Breast lumpectomy  1988    rt  . Appendectomy    . Tonsillectomy    . Cholecystectomy    . Cardiac catheterization  2005    [place]: nonobstructive coronary disease, normal LV function    Social History   Social History  . Marital Status: Married    Spouse Name: N/A  . Number of Children: N/A  . Years of Education: N/A   Occupational History  . Not on file.   Social History Main Topics  . Smoking status: Never Smoker   . Smokeless tobacco: Never Used     Comment: tobacco use - no  . Alcohol Use: No  . Drug Use: No  . Sexual Activity: Not on file   Other Topics Concern  . Not on file   Social History Narrative     Filed Vitals:   10/27/14 1336  BP: 118/68  Pulse: 62  Height: 5' (1.524 m)  Weight: 104 lb 9.6 oz (47.446 kg)  SpO2: 92%    PHYSICAL EXAM General: NAD HEENT: Normal. Neck: No JVD, no thyromegaly. Lungs: Clear to auscultation bilaterally with normal respiratory effort. CV: Nondisplaced PMI.  Regular rate and rhythm, normal S1/S2, no S3/S4, no murmur. No pretibial or periankle edema.  No carotid bruit.  Normal pedal pulses.  Abdomen: Soft, nontender, no hepatosplenomegaly, no distention.  Neurologic: Alert and oriented x 3.  Psych: Normal affect. Skin: Normal. Musculoskeletal: Normal range of motion, no gross deformities. Extremities: No clubbing or cyanosis.   ECG: Most recent ECG reviewed.    ASSESSMENT AND PLAN: 1. Paroxysmal atrial fibrillation: Currently in sinus rhythm and tolerating warfarin. No changes in therapy. On long-acting diltiazem 180 mg daily.   2. Malignant HTN: Well controlled. Intolerant to  several antihypertensives in the past. No changes.  3. CVA: BP control of optimal importance. On warfarin for atrial fibrillation.  Dispo: f/u 6 months.   Kate Sable, M.D., F.A.C.C.

## 2014-11-08 DIAGNOSIS — M79672 Pain in left foot: Secondary | ICD-10-CM | POA: Diagnosis not present

## 2014-11-08 DIAGNOSIS — M79671 Pain in right foot: Secondary | ICD-10-CM | POA: Diagnosis not present

## 2014-11-08 DIAGNOSIS — M25579 Pain in unspecified ankle and joints of unspecified foot: Secondary | ICD-10-CM | POA: Diagnosis not present

## 2014-11-25 DIAGNOSIS — Z23 Encounter for immunization: Secondary | ICD-10-CM | POA: Diagnosis not present

## 2014-12-01 ENCOUNTER — Other Ambulatory Visit: Payer: Self-pay | Admitting: *Deleted

## 2014-12-01 ENCOUNTER — Ambulatory Visit (INDEPENDENT_AMBULATORY_CARE_PROVIDER_SITE_OTHER): Payer: Medicare Other | Admitting: *Deleted

## 2014-12-01 DIAGNOSIS — Z7901 Long term (current) use of anticoagulants: Secondary | ICD-10-CM | POA: Diagnosis not present

## 2014-12-01 DIAGNOSIS — Z8679 Personal history of other diseases of the circulatory system: Secondary | ICD-10-CM

## 2014-12-01 DIAGNOSIS — Z5181 Encounter for therapeutic drug level monitoring: Secondary | ICD-10-CM

## 2014-12-01 LAB — POCT INR: INR: 2.6

## 2014-12-01 MED ORDER — NITROGLYCERIN 0.4 MG SL SUBL: 0.4000 mg | SUBLINGUAL_TABLET | SUBLINGUAL | Status: AC | PRN

## 2014-12-14 DIAGNOSIS — E119 Type 2 diabetes mellitus without complications: Secondary | ICD-10-CM | POA: Diagnosis not present

## 2014-12-14 DIAGNOSIS — E7800 Pure hypercholesterolemia, unspecified: Secondary | ICD-10-CM | POA: Diagnosis not present

## 2014-12-14 DIAGNOSIS — E876 Hypokalemia: Secondary | ICD-10-CM | POA: Diagnosis not present

## 2014-12-14 DIAGNOSIS — I10 Essential (primary) hypertension: Secondary | ICD-10-CM | POA: Diagnosis not present

## 2014-12-16 ENCOUNTER — Other Ambulatory Visit: Payer: Self-pay | Admitting: Cardiovascular Disease

## 2014-12-20 DIAGNOSIS — R944 Abnormal results of kidney function studies: Secondary | ICD-10-CM | POA: Diagnosis not present

## 2014-12-21 DIAGNOSIS — I10 Essential (primary) hypertension: Secondary | ICD-10-CM | POA: Diagnosis not present

## 2014-12-21 DIAGNOSIS — R634 Abnormal weight loss: Secondary | ICD-10-CM | POA: Diagnosis not present

## 2014-12-21 DIAGNOSIS — I4891 Unspecified atrial fibrillation: Secondary | ICD-10-CM | POA: Diagnosis not present

## 2014-12-21 DIAGNOSIS — N183 Chronic kidney disease, stage 3 (moderate): Secondary | ICD-10-CM | POA: Diagnosis not present

## 2014-12-21 DIAGNOSIS — E119 Type 2 diabetes mellitus without complications: Secondary | ICD-10-CM | POA: Diagnosis not present

## 2014-12-21 DIAGNOSIS — Z13220 Encounter for screening for lipoid disorders: Secondary | ICD-10-CM | POA: Diagnosis not present

## 2015-01-12 ENCOUNTER — Ambulatory Visit (INDEPENDENT_AMBULATORY_CARE_PROVIDER_SITE_OTHER): Payer: Medicare Other | Admitting: *Deleted

## 2015-01-12 DIAGNOSIS — Z8679 Personal history of other diseases of the circulatory system: Secondary | ICD-10-CM | POA: Diagnosis not present

## 2015-01-12 DIAGNOSIS — Z5181 Encounter for therapeutic drug level monitoring: Secondary | ICD-10-CM | POA: Diagnosis not present

## 2015-01-12 DIAGNOSIS — Z7901 Long term (current) use of anticoagulants: Secondary | ICD-10-CM

## 2015-01-12 LAB — POCT INR: INR: 2.3

## 2015-01-16 ENCOUNTER — Other Ambulatory Visit: Payer: Self-pay | Admitting: Cardiovascular Disease

## 2015-01-18 DIAGNOSIS — Z853 Personal history of malignant neoplasm of breast: Secondary | ICD-10-CM | POA: Diagnosis not present

## 2015-01-18 DIAGNOSIS — F0390 Unspecified dementia without behavioral disturbance: Secondary | ICD-10-CM | POA: Diagnosis not present

## 2015-01-18 DIAGNOSIS — Z79899 Other long term (current) drug therapy: Secondary | ICD-10-CM | POA: Diagnosis not present

## 2015-01-18 DIAGNOSIS — R42 Dizziness and giddiness: Secondary | ICD-10-CM | POA: Diagnosis not present

## 2015-01-18 DIAGNOSIS — R040 Epistaxis: Secondary | ICD-10-CM | POA: Diagnosis not present

## 2015-01-18 DIAGNOSIS — E119 Type 2 diabetes mellitus without complications: Secondary | ICD-10-CM | POA: Diagnosis not present

## 2015-01-18 DIAGNOSIS — Z85038 Personal history of other malignant neoplasm of large intestine: Secondary | ICD-10-CM | POA: Diagnosis not present

## 2015-01-18 DIAGNOSIS — Z7901 Long term (current) use of anticoagulants: Secondary | ICD-10-CM | POA: Diagnosis not present

## 2015-01-18 DIAGNOSIS — I10 Essential (primary) hypertension: Secondary | ICD-10-CM | POA: Diagnosis not present

## 2015-01-18 DIAGNOSIS — I4891 Unspecified atrial fibrillation: Secondary | ICD-10-CM | POA: Diagnosis not present

## 2015-01-23 ENCOUNTER — Ambulatory Visit (INDEPENDENT_AMBULATORY_CARE_PROVIDER_SITE_OTHER): Payer: Medicare Other | Admitting: Cardiovascular Disease

## 2015-01-23 ENCOUNTER — Encounter: Payer: Self-pay | Admitting: Cardiovascular Disease

## 2015-01-23 VITALS — BP 162/58 | HR 53 | Ht 60.0 in | Wt 104.0 lb

## 2015-01-23 DIAGNOSIS — I10 Essential (primary) hypertension: Secondary | ICD-10-CM

## 2015-01-23 DIAGNOSIS — I639 Cerebral infarction, unspecified: Secondary | ICD-10-CM

## 2015-01-23 DIAGNOSIS — I480 Paroxysmal atrial fibrillation: Secondary | ICD-10-CM | POA: Diagnosis not present

## 2015-01-23 DIAGNOSIS — Z87898 Personal history of other specified conditions: Secondary | ICD-10-CM

## 2015-01-23 DIAGNOSIS — Z9289 Personal history of other medical treatment: Secondary | ICD-10-CM

## 2015-01-23 DIAGNOSIS — Z7901 Long term (current) use of anticoagulants: Secondary | ICD-10-CM

## 2015-01-23 NOTE — Progress Notes (Signed)
Patient ID: Judith Chung, female   DOB: 1924-10-22, 81 y.o.   MRN: [id number]      SUBJECTIVE: Mrs. Kaminski is an 81 year old female with a history of paroxysmal atrial fibrillation and atrial flutter as well as malignant HTN. She is on chronic Coumadin therapy. She has a history of normal LV function and nonobstructive coronary disease by cardiac catheterization 2005.  I previously tried her on amlodipine, chlorthalidone, and hydralzine for HTN, but she did not tolerate these medications. I tried Marisa Severin but it was too expensive for her. I subsequently started her on losartan.   She was evaluated in the ED on 01/18/15 for high blood pressure of 222/114 associated with dizziness. Repeat blood pressure was 169/87 in the ED. Hydrochlorothiazide 12.5 mg was added.   A review of labs showed BUN 33, creatinine 1.58, sodium 143, potassium 3.8, troponin less than 0.01, INR 2.1, hemoglobin 12.5, white blood cell 7.2, platelets 227. Head CT did show old left basal ganglia and occipital lobe infarctions but no acute abnormalities.  She denies chest pain and shortness of breath. They have a meter and said there is nearly 0% humidity in their house.  Review of Systems: As per "subjective", otherwise negative.  Allergies  Allergen Reactions  . Alprazolam     REACTION: sleepy  . Amlodipine Other (See Comments)    Edema feet & legs, nausea  . Atorvastatin     REACTION: myalgia  . Azithromycin     REACTION: rash  . Cephalexin     REACTION: rash  . Chlorthalidone     Nausea, loss of appetite, insomnia, generally did not feel well  . Crestor [Rosuvastatin]   . Epinephrine     REACTION: B/P,chest pain  . Inderal [Propranolol]   . Norvasc [Amlodipine Besylate]   . Penicillins     REACTION: rash  . Polysporin [Bacitracin-Polymyxin B]   . Premarin [Conjugated Estrogens]     rash  . Propranolol Hcl     REACTION: rash  . Quinapril Hcl     REACTION: rash  . Statins     REACTION: myalgia  .  Sulfamethoxazole-Trimethoprim   . Zetia [Ezetimibe]     Current Outpatient Prescriptions  Medication Sig Dispense Refill  . CARTIA XT 180 MG 24 hr capsule TAKE ONE CAPSULE BY MOUTH ONCE DAILY 30 capsule 10  . Cholecalciferol (VITAMIN D3) 400 UNITS tablet Take 400 Units by mouth daily.     . cloNIDine (CATAPRES) 0.1 MG tablet Take 1 tablet (0.1 mg total) by mouth every morning. 30 tablet 6  . cloNIDine (CATAPRES) 0.3 MG tablet Take 1 tablet (0.3 mg total) by mouth every evening. 30 tablet 6  . diazepam (VALIUM) 5 MG tablet Take 5 mg by mouth daily as needed. Anxiety.    . hydrochlorothiazide (HYDRODIURIL) 12.5 MG tablet Take 12.5 mg by mouth daily.    Marland Kitchen losartan (COZAAR) 100 MG tablet TAKE ONE TABLET BY MOUTH ONCE DAILY 30 tablet 10  . nitroGLYCERIN (NITROSTAT) 0.4 MG SL tablet Place 1 tablet (0.4 mg total) under the tongue every 5 (five) minutes x 3 doses as needed for chest pain. If no relief after the 3 rd dose, proceed to the ED for an evaluation. 25 tablet 3  . warfarin (COUMADIN) 2.5 MG tablet Take 2 tablets daily except 1 tablet on Mondays and Thursdays 60 tablet 4   No current facility-administered medications for this visit.    Past Medical History  Diagnosis Date  . PAF (paroxysmal atrial fibrillation) (  [place])   . HTN (hypertension)   . DM2 (diabetes mellitus, type 2) ([place])   . MR (mitral regurgitation)     mild  . CAD (coronary artery disease)     nonobstructive  . Dyslipidemia   . Drug intolerance     intolerant to numerous statins and Zetia  . Anxiety disorder     generalized  . Arthritis   . Breast cancer ([place]) 1988    s/p right lumpectomy  . Colon cancer ([place]) 1991    s/p right hemicolectom    Past Surgical History  Procedure Laterality Date  . Chronic coumadin      CHAD2:3 (medical hx)  . Colon surgery  1991    rt  . Laproscopic cholecystectomy    . Tonsillectomy    . Cataract extraction w/ intraocular lens  implant, bilateral      no cataract extraction    . Breast lumpectomy  1988    rt  . Appendectomy    . Tonsillectomy    . Cholecystectomy    . Cardiac catheterization  2005    [place]: nonobstructive coronary disease, normal LV function    Social History   Social History  . Marital Status: Married    Spouse Name: N/A  . Number of Children: N/A  . Years of Education: N/A   Occupational History  . Not on file.   Social History Main Topics  . Smoking status: Never Smoker   . Smokeless tobacco: Never Used     Comment: tobacco use - no  . Alcohol Use: No  . Drug Use: No  . Sexual Activity: Not on file   Other Topics Concern  . Not on file   Social History Narrative     Filed Vitals:   01/23/15 1336  BP: 162/58  Pulse: 53  Height: 5' (1.524 m)  Weight: 104 lb (47.174 kg)  SpO2: 96%    PHYSICAL EXAM General: NAD HEENT: Normal. Neck: No JVD, no thyromegaly. Lungs: Clear to auscultation bilaterally with normal respiratory effort. CV: Nondisplaced PMI.  Regular rate and rhythm, normal S1/S2, no S3/S4, no murmur. No pretibial or periankle edema.    Abdomen: Soft, nontender, no hepatosplenomegaly, no distention.  Neurologic: Alert and oriented x 3.  Psych: Normal affect. Skin: Normal. Musculoskeletal: No gross deformities. Extremities: No clubbing or cyanosis.   ECG: Most recent ECG reviewed.      ASSESSMENT AND PLAN: 1. Paroxysmal atrial fibrillation: Currently in a regular rhythm and tolerating warfarin. No changes in therapy. On long-acting diltiazem 180 mg daily.   2. Malignant HTN: Mildly elevated. Intolerant to several antihypertensives in the past. Given recent addition of HCTZ 12.5 mg, will not make any adjustments.  3. CVA: BP control of optimal importance. On warfarin for atrial fibrillation.  4. Nosebleeds: Recommended a home humidifier.  Dispo: f/u 6 months.   Kate Sable, M.D., F.A.C.C.

## 2015-01-23 NOTE — Patient Instructions (Signed)
Continue all current medications. Your physician wants you to follow up in: 6 months.  You will receive a reminder letter in the mail one-two months in advance.  If you don't receive a letter, please call our office to schedule the follow up appointment   

## 2015-02-08 DIAGNOSIS — E119 Type 2 diabetes mellitus without complications: Secondary | ICD-10-CM | POA: Diagnosis not present

## 2015-02-08 DIAGNOSIS — Z961 Presence of intraocular lens: Secondary | ICD-10-CM | POA: Diagnosis not present

## 2015-02-13 ENCOUNTER — Other Ambulatory Visit: Payer: Self-pay | Admitting: *Deleted

## 2015-02-13 MED ORDER — HYDROCHLOROTHIAZIDE 12.5 MG PO TABS: 12.5000 mg | ORAL_TABLET | Freq: Every day | ORAL | Status: DC

## 2015-02-15 ENCOUNTER — Other Ambulatory Visit: Payer: Self-pay | Admitting: Cardiovascular Disease

## 2015-02-19 IMAGING — CR DG CHEST 2V
2 series · 2 of 2 positions shown · non-contrast
Comparison: 10/12/2012

CLINICAL DATA: Chest pain, shortness of Breath.

EXAM:
CHEST  2 VIEW

[view not recorded (1 of 2)]
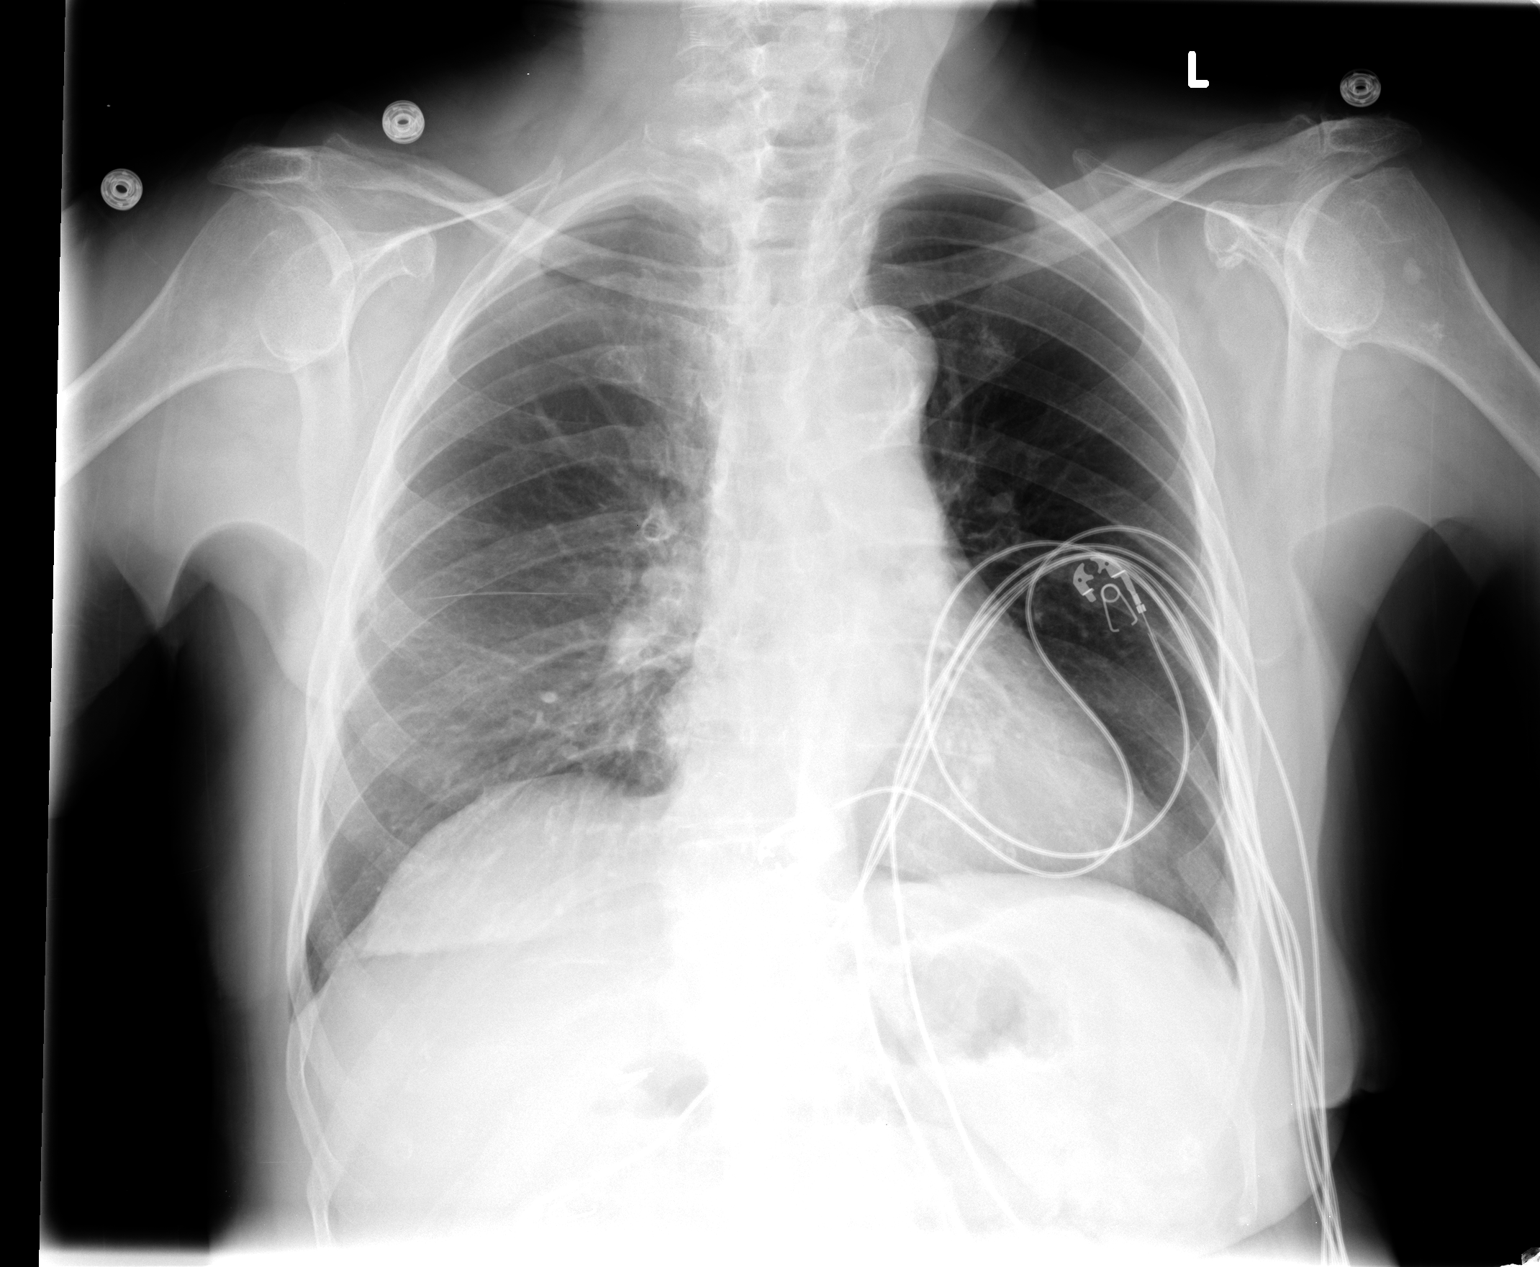

[view not recorded (2 of 2)]
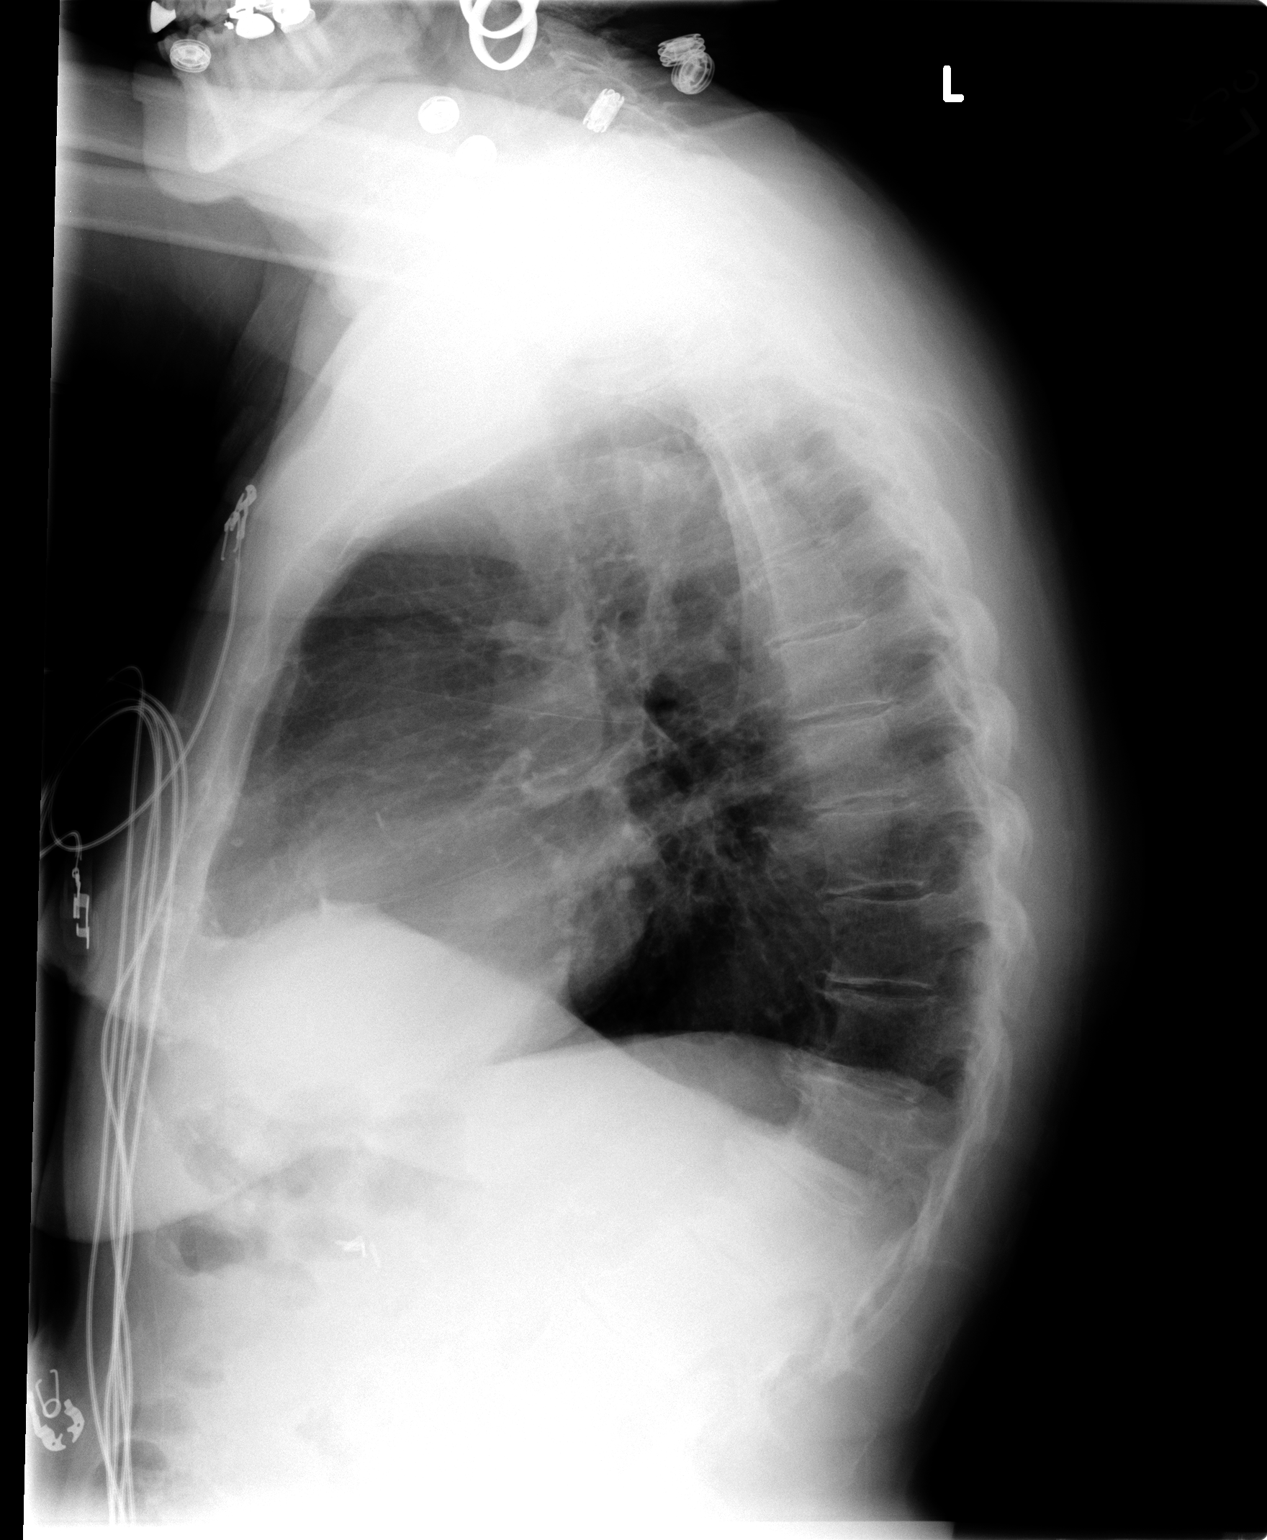

[2 of 2 positions shown; findings below may reference images not displayed]

FINDINGS: The heart size and mediastinal contours are within normal limits.
Both lungs are clear. The visualized skeletal structures are
unremarkable.
IMPRESSION: No active cardiopulmonary disease.

## 2015-02-23 ENCOUNTER — Ambulatory Visit (INDEPENDENT_AMBULATORY_CARE_PROVIDER_SITE_OTHER): Payer: Medicare Other | Admitting: *Deleted

## 2015-02-23 DIAGNOSIS — Z8679 Personal history of other diseases of the circulatory system: Secondary | ICD-10-CM

## 2015-02-23 DIAGNOSIS — Z5181 Encounter for therapeutic drug level monitoring: Secondary | ICD-10-CM

## 2015-02-23 DIAGNOSIS — Z7901 Long term (current) use of anticoagulants: Secondary | ICD-10-CM

## 2015-02-23 LAB — POCT INR: INR: 2.9

## 2015-04-06 ENCOUNTER — Ambulatory Visit (INDEPENDENT_AMBULATORY_CARE_PROVIDER_SITE_OTHER): Payer: Medicare Other | Admitting: *Deleted

## 2015-04-06 DIAGNOSIS — Z8679 Personal history of other diseases of the circulatory system: Secondary | ICD-10-CM | POA: Diagnosis not present

## 2015-04-06 DIAGNOSIS — Z7901 Long term (current) use of anticoagulants: Secondary | ICD-10-CM | POA: Diagnosis not present

## 2015-04-06 DIAGNOSIS — Z5181 Encounter for therapeutic drug level monitoring: Secondary | ICD-10-CM | POA: Diagnosis not present

## 2015-04-06 LAB — POCT INR: INR: 2.2

## 2015-04-25 ENCOUNTER — Ambulatory Visit: Payer: Medicare Other | Admitting: Cardiovascular Disease

## 2015-05-18 ENCOUNTER — Ambulatory Visit (INDEPENDENT_AMBULATORY_CARE_PROVIDER_SITE_OTHER): Payer: Medicare Other | Admitting: *Deleted

## 2015-05-18 DIAGNOSIS — Z7901 Long term (current) use of anticoagulants: Secondary | ICD-10-CM

## 2015-05-18 DIAGNOSIS — Z8679 Personal history of other diseases of the circulatory system: Secondary | ICD-10-CM

## 2015-05-18 DIAGNOSIS — Z5181 Encounter for therapeutic drug level monitoring: Secondary | ICD-10-CM

## 2015-05-18 LAB — POCT INR: INR: 2.7

## 2015-06-16 DIAGNOSIS — E162 Hypoglycemia, unspecified: Secondary | ICD-10-CM | POA: Diagnosis not present

## 2015-06-16 DIAGNOSIS — E7800 Pure hypercholesterolemia, unspecified: Secondary | ICD-10-CM | POA: Diagnosis not present

## 2015-06-16 DIAGNOSIS — E119 Type 2 diabetes mellitus without complications: Secondary | ICD-10-CM | POA: Diagnosis not present

## 2015-06-16 DIAGNOSIS — I4891 Unspecified atrial fibrillation: Secondary | ICD-10-CM | POA: Diagnosis not present

## 2015-06-16 DIAGNOSIS — I10 Essential (primary) hypertension: Secondary | ICD-10-CM | POA: Diagnosis not present

## 2015-06-16 DIAGNOSIS — E876 Hypokalemia: Secondary | ICD-10-CM | POA: Diagnosis not present

## 2015-06-16 DIAGNOSIS — M1990 Unspecified osteoarthritis, unspecified site: Secondary | ICD-10-CM | POA: Diagnosis not present

## 2015-06-16 DIAGNOSIS — D519 Vitamin B12 deficiency anemia, unspecified: Secondary | ICD-10-CM | POA: Diagnosis not present

## 2015-06-16 DIAGNOSIS — N183 Chronic kidney disease, stage 3 (moderate): Secondary | ICD-10-CM | POA: Diagnosis not present

## 2015-06-16 DIAGNOSIS — E1169 Type 2 diabetes mellitus with other specified complication: Secondary | ICD-10-CM | POA: Diagnosis not present

## 2015-06-23 ENCOUNTER — Other Ambulatory Visit: Payer: Self-pay | Admitting: Cardiovascular Disease

## 2015-06-23 DIAGNOSIS — I639 Cerebral infarction, unspecified: Secondary | ICD-10-CM | POA: Diagnosis not present

## 2015-06-23 DIAGNOSIS — I69322 Dysarthria following cerebral infarction: Secondary | ICD-10-CM | POA: Diagnosis not present

## 2015-06-23 DIAGNOSIS — M1990 Unspecified osteoarthritis, unspecified site: Secondary | ICD-10-CM | POA: Diagnosis not present

## 2015-06-23 DIAGNOSIS — I4891 Unspecified atrial fibrillation: Secondary | ICD-10-CM | POA: Diagnosis not present

## 2015-06-23 DIAGNOSIS — E1140 Type 2 diabetes mellitus with diabetic neuropathy, unspecified: Secondary | ICD-10-CM | POA: Diagnosis not present

## 2015-06-23 DIAGNOSIS — N183 Chronic kidney disease, stage 3 (moderate): Secondary | ICD-10-CM | POA: Diagnosis not present

## 2015-06-23 DIAGNOSIS — I10 Essential (primary) hypertension: Secondary | ICD-10-CM | POA: Diagnosis not present

## 2015-06-27 DIAGNOSIS — Z9889 Other specified postprocedural states: Secondary | ICD-10-CM | POA: Diagnosis not present

## 2015-06-27 DIAGNOSIS — R270 Ataxia, unspecified: Secondary | ICD-10-CM | POA: Diagnosis not present

## 2015-06-27 DIAGNOSIS — Z85038 Personal history of other malignant neoplasm of large intestine: Secondary | ICD-10-CM | POA: Diagnosis not present

## 2015-06-27 DIAGNOSIS — I6782 Cerebral ischemia: Secondary | ICD-10-CM | POA: Diagnosis not present

## 2015-06-27 DIAGNOSIS — I69322 Dysarthria following cerebral infarction: Secondary | ICD-10-CM | POA: Diagnosis not present

## 2015-06-27 DIAGNOSIS — Z853 Personal history of malignant neoplasm of breast: Secondary | ICD-10-CM | POA: Diagnosis not present

## 2015-06-27 DIAGNOSIS — I639 Cerebral infarction, unspecified: Secondary | ICD-10-CM | POA: Diagnosis not present

## 2015-06-29 ENCOUNTER — Ambulatory Visit (INDEPENDENT_AMBULATORY_CARE_PROVIDER_SITE_OTHER): Payer: Medicare Other | Admitting: *Deleted

## 2015-06-29 DIAGNOSIS — Z5181 Encounter for therapeutic drug level monitoring: Secondary | ICD-10-CM | POA: Diagnosis not present

## 2015-06-29 DIAGNOSIS — Z7901 Long term (current) use of anticoagulants: Secondary | ICD-10-CM

## 2015-06-29 DIAGNOSIS — Z8679 Personal history of other diseases of the circulatory system: Secondary | ICD-10-CM | POA: Diagnosis not present

## 2015-06-29 LAB — POCT INR: INR: 1.4

## 2015-06-30 ENCOUNTER — Telehealth: Payer: Self-pay | Admitting: *Deleted

## 2015-06-30 NOTE — Telephone Encounter (Signed)
Called spoke with Judith Chung pt's husband advised to have pt go ahead and take the 3 tablets today, instead of her noral 1 tablet dosage then resume previous dosage of 2 tablets daily except 1 tablet on Mondays and Fridays.  Keep scheduled follow-up appt. Pt's husband verbalized understanding.

## 2015-06-30 NOTE — Telephone Encounter (Signed)
Pt husband called, wife was seen yesterday INR check, husband says she gets confused and did not take 3 tabs last night as directed (INR 1.4). Husband Quita Skye) says she only took normal dose 2 tabs. Will forward to [address] anti coag with [name] out of office today with advice on how pt needs to take coumadin going forward.

## 2015-07-03 ENCOUNTER — Encounter: Payer: Self-pay | Admitting: *Deleted

## 2015-07-03 ENCOUNTER — Other Ambulatory Visit: Payer: Self-pay | Admitting: *Deleted

## 2015-07-03 MED ORDER — WARFARIN SODIUM 2.5 MG PO TABS: ORAL_TABLET | ORAL | Status: DC

## 2015-07-10 DIAGNOSIS — Z853 Personal history of malignant neoplasm of breast: Secondary | ICD-10-CM | POA: Diagnosis not present

## 2015-07-10 DIAGNOSIS — Z1231 Encounter for screening mammogram for malignant neoplasm of breast: Secondary | ICD-10-CM | POA: Diagnosis not present

## 2015-07-13 ENCOUNTER — Ambulatory Visit (INDEPENDENT_AMBULATORY_CARE_PROVIDER_SITE_OTHER): Payer: Medicare Other | Admitting: *Deleted

## 2015-07-13 DIAGNOSIS — Z5181 Encounter for therapeutic drug level monitoring: Secondary | ICD-10-CM

## 2015-07-13 DIAGNOSIS — Z7901 Long term (current) use of anticoagulants: Secondary | ICD-10-CM

## 2015-07-13 DIAGNOSIS — Z8679 Personal history of other diseases of the circulatory system: Secondary | ICD-10-CM

## 2015-07-13 LAB — POCT INR: INR: 2.4

## 2015-07-26 ENCOUNTER — Encounter: Payer: Self-pay | Admitting: Cardiovascular Disease

## 2015-07-26 ENCOUNTER — Ambulatory Visit (INDEPENDENT_AMBULATORY_CARE_PROVIDER_SITE_OTHER): Payer: Medicare Other | Admitting: Cardiovascular Disease

## 2015-07-26 VITALS — BP 155/68 | HR 52 | Ht 60.0 in | Wt 105.0 lb

## 2015-07-26 DIAGNOSIS — I480 Paroxysmal atrial fibrillation: Secondary | ICD-10-CM | POA: Diagnosis not present

## 2015-07-26 DIAGNOSIS — I10 Essential (primary) hypertension: Secondary | ICD-10-CM | POA: Diagnosis not present

## 2015-07-26 DIAGNOSIS — Z7901 Long term (current) use of anticoagulants: Secondary | ICD-10-CM

## 2015-07-26 NOTE — Progress Notes (Signed)
Patient ID: Judith Chung, female   DOB: [birth year], 82 y.o.   MRN: [id number]      SUBJECTIVE: Judith Chung is an 82 year old female with a history of paroxysmal atrial fibrillation and atrial flutter as well as malignant HTN. She is on chronic Coumadin therapy. She has a history of normal LV function and nonobstructive coronary disease by cardiac catheterization 2005.  I previously tried her on amlodipine, chlorthalidone, and hydralzine for HTN, but she did not tolerate these medications. I tried Judith Chung but it was too expensive for her. I subsequently started her on losartan.   She was evaluated in the ED on 01/18/15 for high blood pressure of 222/114 associated with dizziness. Repeat blood pressure was 169/87 in the ED. Hydrochlorothiazide 12.5 mg was added.   She has been feeling well and denies chest pain, palpitations, and shortness of breath.  They have routinely been checking blood pressure at home and systolic readings have been between 130 and 140.   Review of Systems: As per "subjective", otherwise negative.  Allergies  Allergen Reactions  . Alprazolam     REACTION: sleepy  . Amlodipine Other (See Comments)    Edema feet & legs, nausea  . Atorvastatin     REACTION: myalgia  . Azithromycin     REACTION: rash  . Cephalexin     REACTION: rash  . Chlorthalidone     Nausea, loss of appetite, insomnia, generally did not feel well  . Crestor [Rosuvastatin]   . Epinephrine     REACTION: B/P,chest pain  . Inderal [Propranolol]   . Norvasc [Amlodipine Besylate]   . Penicillins     REACTION: rash  . Polysporin [Bacitracin-Polymyxin B]   . Premarin [Conjugated Estrogens]     rash  . Propranolol Hcl     REACTION: rash  . Quinapril Hcl     REACTION: rash  . Statins     REACTION: myalgia  . Sulfamethoxazole-Trimethoprim   . Zetia [Ezetimibe]     Current Outpatient Prescriptions  Medication Sig Dispense Refill  . CARTIA XT 180 MG 24 hr capsule TAKE ONE CAPSULE BY  MOUTH ONCE DAILY 30 capsule 10  . Cholecalciferol (VITAMIN D3) 400 UNITS tablet Take 400 Units by mouth daily.     . cloNIDine (CATAPRES) 0.1 MG tablet Take 1 tablet (0.1 mg total) by mouth every morning. 30 tablet 6  . cloNIDine (CATAPRES) 0.3 MG tablet TAKE ONE TABLET BY MOUTH IN THE EVENING 30 tablet 6  . hydrochlorothiazide (HYDRODIURIL) 12.5 MG tablet Take 1 tablet (12.5 mg total) by mouth daily. 90 tablet 3  . losartan (COZAAR) 100 MG tablet TAKE ONE TABLET BY MOUTH ONCE DAILY 30 tablet 10  . nitroGLYCERIN (NITROSTAT) 0.4 MG SL tablet Place 1 tablet (0.4 mg total) under the tongue every 5 (five) minutes x 3 doses as needed for chest pain. If no relief after the 3 rd dose, proceed to the ED for an evaluation. 25 tablet 3  . warfarin (COUMADIN) 2.5 MG tablet Take 2 tablets daily except 1 tablet on Mondays and Thursdays 60 tablet 4   No current facility-administered medications for this visit.    Past Medical History  Diagnosis Date  . PAF (paroxysmal atrial fibrillation) ([place])   . HTN (hypertension)   . DM2 (diabetes mellitus, type 2) ([place])   . MR (mitral regurgitation)     mild  . CAD (coronary artery disease)     nonobstructive  . Dyslipidemia   . Drug intolerance  intolerant to numerous statins and Zetia  . Anxiety disorder     generalized  . Arthritis   . Breast cancer ([place]) 1988    s/p right lumpectomy  . Colon cancer ([place]) 1991    s/p right hemicolectom    Past Surgical History  Procedure Laterality Date  . Chronic coumadin      CHAD2:3 (medical hx)  . Colon surgery  1991    rt  . Laproscopic cholecystectomy    . Tonsillectomy    . Cataract extraction w/ intraocular lens  implant, bilateral      no cataract extraction   . Breast lumpectomy  1988    rt  . Appendectomy    . Tonsillectomy    . Cholecystectomy    . Cardiac catheterization  2005    [place]: nonobstructive coronary disease, normal LV function    Social History   Social History  . Marital  Status: Married    Spouse Name: N/A  . Number of Children: N/A  . Years of Education: N/A   Occupational History  . Not on file.   Social History Main Topics  . Smoking status: Never Smoker   . Smokeless tobacco: Never Used     Comment: tobacco use - no  . Alcohol Use: No  . Drug Use: No  . Sexual Activity: Not on file   Other Topics Concern  . Not on file   Social History Narrative     Filed Vitals:   07/26/15 1136  BP: 155/68  Pulse: 52  Height: 5' (1.524 m)  Weight: 105 lb (47.628 kg)    PHYSICAL EXAM General: NAD HEENT: Normal. Neck: No JVD, no thyromegaly. Lungs: Clear to auscultation bilaterally with normal respiratory effort. CV: Nondisplaced PMI.  Regular rate and rhythm, normal S1/S2, no S3/S4, no murmur. No pretibial or periankle edema.    Abdomen: Soft, nontender, no distention.  Neurologic: Alert and oriented.  Psych: Normal affect. Skin: Normal. Musculoskeletal: No gross deformities.    ECG: Most recent ECG reviewed.      ASSESSMENT AND PLAN: 1. Paroxysmal atrial fibrillation: Currently in a regular rhythm and tolerating warfarin. No changes in therapy. On long-acting diltiazem 180 mg daily.   2. Malignant HTN: Mildly elevated for age in office but routinely normal at home. Intolerant to several antihypertensives in the past. Will not make any adjustments.  3. CVA: BP control of optimal importance. On warfarin for atrial fibrillation.   Dispo: f/u 6 months.   Kate Sable, M.D., F.A.C.C.

## 2015-07-26 NOTE — Patient Instructions (Signed)
Continue all current medications. Your physician wants you to follow up in: 6 months.  You will receive a reminder letter in the mail one-two months in advance.  If you don't receive a letter, please call our office to schedule the follow up appointment   

## 2015-08-10 ENCOUNTER — Ambulatory Visit (INDEPENDENT_AMBULATORY_CARE_PROVIDER_SITE_OTHER): Payer: Medicare Other | Admitting: *Deleted

## 2015-08-10 DIAGNOSIS — Z7901 Long term (current) use of anticoagulants: Secondary | ICD-10-CM

## 2015-08-10 DIAGNOSIS — Z5181 Encounter for therapeutic drug level monitoring: Secondary | ICD-10-CM | POA: Diagnosis not present

## 2015-08-10 DIAGNOSIS — Z8679 Personal history of other diseases of the circulatory system: Secondary | ICD-10-CM

## 2015-08-10 LAB — POCT INR: INR: 2.3

## 2015-09-12 ENCOUNTER — Ambulatory Visit (INDEPENDENT_AMBULATORY_CARE_PROVIDER_SITE_OTHER): Payer: Medicare Other | Admitting: *Deleted

## 2015-09-12 DIAGNOSIS — Z8679 Personal history of other diseases of the circulatory system: Secondary | ICD-10-CM | POA: Diagnosis not present

## 2015-09-12 DIAGNOSIS — Z5181 Encounter for therapeutic drug level monitoring: Secondary | ICD-10-CM

## 2015-09-12 DIAGNOSIS — Z7901 Long term (current) use of anticoagulants: Secondary | ICD-10-CM | POA: Diagnosis not present

## 2015-09-12 LAB — POCT INR: INR: 2.5

## 2015-10-17 ENCOUNTER — Ambulatory Visit (INDEPENDENT_AMBULATORY_CARE_PROVIDER_SITE_OTHER): Payer: Medicare Other | Admitting: *Deleted

## 2015-10-17 DIAGNOSIS — Z8679 Personal history of other diseases of the circulatory system: Secondary | ICD-10-CM

## 2015-10-17 DIAGNOSIS — Z5181 Encounter for therapeutic drug level monitoring: Secondary | ICD-10-CM

## 2015-10-17 DIAGNOSIS — Z7901 Long term (current) use of anticoagulants: Secondary | ICD-10-CM

## 2015-10-17 LAB — POCT INR: INR: 1.4

## 2015-10-23 ENCOUNTER — Other Ambulatory Visit: Payer: Self-pay | Admitting: Cardiovascular Disease

## 2015-10-23 DIAGNOSIS — Z853 Personal history of malignant neoplasm of breast: Secondary | ICD-10-CM | POA: Diagnosis not present

## 2015-10-23 DIAGNOSIS — Z8249 Family history of ischemic heart disease and other diseases of the circulatory system: Secondary | ICD-10-CM | POA: Diagnosis not present

## 2015-10-23 DIAGNOSIS — F0390 Unspecified dementia without behavioral disturbance: Secondary | ICD-10-CM | POA: Diagnosis not present

## 2015-10-23 DIAGNOSIS — Z7901 Long term (current) use of anticoagulants: Secondary | ICD-10-CM | POA: Diagnosis not present

## 2015-10-23 DIAGNOSIS — S0990XA Unspecified injury of head, initial encounter: Secondary | ICD-10-CM | POA: Diagnosis not present

## 2015-10-23 DIAGNOSIS — E119 Type 2 diabetes mellitus without complications: Secondary | ICD-10-CM | POA: Diagnosis not present

## 2015-10-23 DIAGNOSIS — Z79899 Other long term (current) drug therapy: Secondary | ICD-10-CM | POA: Diagnosis not present

## 2015-10-23 DIAGNOSIS — W010XXA Fall on same level from slipping, tripping and stumbling without subsequent striking against object, initial encounter: Secondary | ICD-10-CM | POA: Diagnosis not present

## 2015-10-23 DIAGNOSIS — I4891 Unspecified atrial fibrillation: Secondary | ICD-10-CM | POA: Diagnosis not present

## 2015-10-23 DIAGNOSIS — I10 Essential (primary) hypertension: Secondary | ICD-10-CM | POA: Diagnosis not present

## 2015-10-23 DIAGNOSIS — Z85038 Personal history of other malignant neoplasm of large intestine: Secondary | ICD-10-CM | POA: Diagnosis not present

## 2015-10-23 DIAGNOSIS — S060X0A Concussion without loss of consciousness, initial encounter: Secondary | ICD-10-CM | POA: Diagnosis not present

## 2015-10-23 DIAGNOSIS — S0101XA Laceration without foreign body of scalp, initial encounter: Secondary | ICD-10-CM | POA: Diagnosis not present

## 2015-10-24 DIAGNOSIS — S0990XA Unspecified injury of head, initial encounter: Secondary | ICD-10-CM | POA: Diagnosis not present

## 2015-10-26 DIAGNOSIS — H26493 Other secondary cataract, bilateral: Secondary | ICD-10-CM | POA: Diagnosis not present

## 2015-10-26 DIAGNOSIS — E119 Type 2 diabetes mellitus without complications: Secondary | ICD-10-CM | POA: Diagnosis not present

## 2015-10-30 DIAGNOSIS — Z4802 Encounter for removal of sutures: Secondary | ICD-10-CM | POA: Diagnosis not present

## 2015-10-31 ENCOUNTER — Ambulatory Visit (INDEPENDENT_AMBULATORY_CARE_PROVIDER_SITE_OTHER): Payer: Medicare Other | Admitting: *Deleted

## 2015-10-31 DIAGNOSIS — Z5181 Encounter for therapeutic drug level monitoring: Secondary | ICD-10-CM | POA: Diagnosis not present

## 2015-10-31 DIAGNOSIS — Z8679 Personal history of other diseases of the circulatory system: Secondary | ICD-10-CM | POA: Diagnosis not present

## 2015-10-31 DIAGNOSIS — Z7901 Long term (current) use of anticoagulants: Secondary | ICD-10-CM | POA: Diagnosis not present

## 2015-10-31 LAB — POCT INR: INR: 3.2

## 2015-11-14 ENCOUNTER — Other Ambulatory Visit: Payer: Self-pay | Admitting: Cardiovascular Disease

## 2015-11-21 ENCOUNTER — Ambulatory Visit (INDEPENDENT_AMBULATORY_CARE_PROVIDER_SITE_OTHER): Payer: Medicare Other | Admitting: *Deleted

## 2015-11-21 DIAGNOSIS — Z8679 Personal history of other diseases of the circulatory system: Secondary | ICD-10-CM

## 2015-11-21 DIAGNOSIS — Z7901 Long term (current) use of anticoagulants: Secondary | ICD-10-CM

## 2015-11-21 DIAGNOSIS — Z5181 Encounter for therapeutic drug level monitoring: Secondary | ICD-10-CM | POA: Diagnosis not present

## 2015-11-21 LAB — POCT INR: INR: 1.5

## 2015-11-22 DIAGNOSIS — Z23 Encounter for immunization: Secondary | ICD-10-CM | POA: Diagnosis not present

## 2015-12-05 ENCOUNTER — Ambulatory Visit (INDEPENDENT_AMBULATORY_CARE_PROVIDER_SITE_OTHER): Payer: Medicare Other | Admitting: *Deleted

## 2015-12-05 DIAGNOSIS — Z8679 Personal history of other diseases of the circulatory system: Secondary | ICD-10-CM

## 2015-12-05 DIAGNOSIS — Z5181 Encounter for therapeutic drug level monitoring: Secondary | ICD-10-CM | POA: Diagnosis not present

## 2015-12-05 DIAGNOSIS — Z7901 Long term (current) use of anticoagulants: Secondary | ICD-10-CM | POA: Diagnosis not present

## 2015-12-05 LAB — POCT INR: INR: 1.7

## 2015-12-19 DIAGNOSIS — N183 Chronic kidney disease, stage 3 (moderate): Secondary | ICD-10-CM | POA: Diagnosis not present

## 2015-12-19 DIAGNOSIS — E1140 Type 2 diabetes mellitus with diabetic neuropathy, unspecified: Secondary | ICD-10-CM | POA: Diagnosis not present

## 2015-12-19 DIAGNOSIS — R944 Abnormal results of kidney function studies: Secondary | ICD-10-CM | POA: Diagnosis not present

## 2015-12-19 DIAGNOSIS — E876 Hypokalemia: Secondary | ICD-10-CM | POA: Diagnosis not present

## 2015-12-19 DIAGNOSIS — I10 Essential (primary) hypertension: Secondary | ICD-10-CM | POA: Diagnosis not present

## 2015-12-26 ENCOUNTER — Ambulatory Visit (INDEPENDENT_AMBULATORY_CARE_PROVIDER_SITE_OTHER): Payer: Medicare Other | Admitting: *Deleted

## 2015-12-26 DIAGNOSIS — Z5181 Encounter for therapeutic drug level monitoring: Secondary | ICD-10-CM | POA: Diagnosis not present

## 2015-12-26 DIAGNOSIS — Z7901 Long term (current) use of anticoagulants: Secondary | ICD-10-CM

## 2015-12-26 DIAGNOSIS — Z8679 Personal history of other diseases of the circulatory system: Secondary | ICD-10-CM | POA: Diagnosis not present

## 2015-12-26 LAB — POCT INR: INR: 2.5

## 2016-01-01 DIAGNOSIS — E1165 Type 2 diabetes mellitus with hyperglycemia: Secondary | ICD-10-CM | POA: Diagnosis not present

## 2016-01-01 DIAGNOSIS — I10 Essential (primary) hypertension: Secondary | ICD-10-CM | POA: Diagnosis not present

## 2016-01-01 DIAGNOSIS — Z6821 Body mass index (BMI) 21.0-21.9, adult: Secondary | ICD-10-CM | POA: Diagnosis not present

## 2016-01-19 ENCOUNTER — Other Ambulatory Visit: Payer: Self-pay | Admitting: Cardiology

## 2016-01-22 ENCOUNTER — Ambulatory Visit (INDEPENDENT_AMBULATORY_CARE_PROVIDER_SITE_OTHER): Payer: Medicare Other | Admitting: Cardiovascular Disease

## 2016-01-22 ENCOUNTER — Other Ambulatory Visit: Payer: Self-pay | Admitting: *Deleted

## 2016-01-22 ENCOUNTER — Encounter: Payer: Self-pay | Admitting: Cardiovascular Disease

## 2016-01-22 VITALS — BP 168/73 | HR 58 | Ht 60.0 in | Wt 107.0 lb

## 2016-01-22 DIAGNOSIS — Z8679 Personal history of other diseases of the circulatory system: Secondary | ICD-10-CM

## 2016-01-22 DIAGNOSIS — I480 Paroxysmal atrial fibrillation: Secondary | ICD-10-CM

## 2016-01-22 DIAGNOSIS — I10 Essential (primary) hypertension: Secondary | ICD-10-CM

## 2016-01-22 MED ORDER — WARFARIN SODIUM 2.5 MG PO TABS: ORAL_TABLET | ORAL | 3 refills | Status: DC

## 2016-01-22 NOTE — Progress Notes (Signed)
SUBJECTIVE: Mrs. Newburg is an 82 year old female with a history of paroxysmal atrial fibrillation and atrial flutter as well as malignant HTN. She is on chronic Coumadin therapy. She has a history of normal LV function and nonobstructive coronary disease by cardiac catheterization 2005.  I previously tried her on amlodipine, chlorthalidone, and hydralzine for HTN, but she did not tolerate these medications. I tried Marisa Severin but it was too expensive for her. I subsequently started her on losartan.   She has been feeling well and denies chest pain, palpitations, and shortness of breath.  Systolic BP at home routinely in 130's.  Review of Systems: As per "subjective", otherwise negative.  Allergies  Allergen Reactions  . Alprazolam     REACTION: sleepy  . Amlodipine Other (See Comments)    Edema feet & legs, nausea  . Atorvastatin     REACTION: myalgia  . Azithromycin     REACTION: rash  . Cephalexin     REACTION: rash  . Chlorthalidone     Nausea, loss of appetite, insomnia, generally did not feel well  . Crestor [Rosuvastatin]   . Epinephrine     REACTION: B/P,chest pain  . Inderal [Propranolol]   . Norvasc [Amlodipine Besylate]   . Penicillins     REACTION: rash  . Polysporin [Bacitracin-Polymyxin B]   . Premarin [Conjugated Estrogens]     rash  . Propranolol Hcl     REACTION: rash  . Quinapril Hcl     REACTION: rash  . Statins     REACTION: myalgia  . Sulfamethoxazole-Trimethoprim   . Zetia [Ezetimibe]     Current Outpatient Prescriptions  Medication Sig Dispense Refill  . CARTIA XT 180 MG 24 hr capsule TAKE ONE CAPSULE BY MOUTH ONCE DAILY 30 capsule 10  . Cholecalciferol (VITAMIN D3) 400 UNITS tablet Take 400 Units by mouth daily.     . cloNIDine (CATAPRES) 0.1 MG tablet TAKE ONE TABLET BY MOUTH ONCE DAILY 30 tablet 3  . cloNIDine (CATAPRES) 0.3 MG tablet TAKE ONE TABLET BY MOUTH IN THE EVENING 30 tablet 6  . hydrochlorothiazide (HYDRODIURIL) 12.5 MG  tablet Take 1 tablet (12.5 mg total) by mouth daily. 90 tablet 3  . losartan (COZAAR) 100 MG tablet TAKE ONE TABLET BY MOUTH ONCE DAILY 30 tablet 10  . nitroGLYCERIN (NITROSTAT) 0.4 MG SL tablet Place 1 tablet (0.4 mg total) under the tongue every 5 (five) minutes x 3 doses as needed for chest pain. If no relief after the 3 rd dose, proceed to the ED for an evaluation. 25 tablet 3  . warfarin (COUMADIN) 2.5 MG tablet Take 2 tablets daily except 1 tablet on Mondays and Thursdays 60 tablet 4   No current facility-administered medications for this visit.     Past Medical History:  Diagnosis Date  . Anxiety disorder    generalized  . Arthritis   . Breast cancer ([place]) 1988   s/p right lumpectomy  . CAD (coronary artery disease)    nonobstructive  . Colon cancer ([place]) 1991   s/p right hemicolectom  . DM2 (diabetes mellitus, type 2) ([place])   . Drug intolerance    intolerant to numerous statins and Zetia  . Dyslipidemia   . HTN (hypertension)   . MR (mitral regurgitation)    mild  . PAF (paroxysmal atrial fibrillation) ([place])     Past Surgical History:  Procedure Laterality Date  . APPENDECTOMY    . BREAST LUMPECTOMY  1988   rt  .  CARDIAC CATHETERIZATION  2005   [place]: nonobstructive coronary disease, normal LV function  . CATARACT EXTRACTION W/ INTRAOCULAR LENS  IMPLANT, BILATERAL     no cataract extraction   . CHOLECYSTECTOMY    . chronic coumadin     CHAD2:3 (medical hx)  . COLON SURGERY  1991   rt  . laproscopic cholecystectomy    . TONSILLECTOMY    . TONSILLECTOMY      Social History   Social History  . Marital status: Married    Spouse name: N/A  . Number of children: N/A  . Years of education: N/A   Occupational History  . Not on file.   Social History Main Topics  . Smoking status: Never Smoker  . Smokeless tobacco: Never Used     Comment: tobacco use - no  . Alcohol use No  . Drug use: No  . Sexual activity: Not on file   Other Topics Concern  .  Not on file   Social History Narrative  . No narrative on file     Vitals:   01/22/16 1300  BP: (!) 168/73  Pulse: (!) 58  Weight: 107 lb (48.5 kg)  Height: 5' (1.524 m)    PHYSICAL EXAM General: NAD HEENT: Normal. Neck: No JVD, no thyromegaly. Lungs: Clear to auscultation bilaterally with normal respiratory effort. CV: Nondisplaced PMI.  Regular rate and rhythm, normal S1/S2, no S3/S4, no murmur. No pretibial or periankle edema.  No carotid bruit.   Abdomen: Soft, nontender, no distention.  Neurologic: Alert and oriented.  Psych: Normal affect. Skin: Normal. Musculoskeletal: No gross deformities.    ECG: Most recent ECG reviewed.      ASSESSMENT AND PLAN: 1. Paroxysmal atrial fibrillation: Currently in a regular rhythm and tolerating warfarin. No changes in therapy. No longer on long-acting diltiazem 180 mg daily.   2. Malignant HTN: Elevated in office, but systolic BP at home routinely in 130's. Intolerant to several antihypertensives in the past. No med changes.  3. CVA: BP control of optimal importance. On warfarin for atrial fibrillation.   Dispo: f/u 6 months.   Kate Sable, M.D., F.A.C.C.

## 2016-01-22 NOTE — Patient Instructions (Signed)

## 2016-01-23 ENCOUNTER — Ambulatory Visit (INDEPENDENT_AMBULATORY_CARE_PROVIDER_SITE_OTHER): Payer: Medicare Other | Admitting: *Deleted

## 2016-01-23 DIAGNOSIS — Z5181 Encounter for therapeutic drug level monitoring: Secondary | ICD-10-CM

## 2016-01-23 DIAGNOSIS — Z7901 Long term (current) use of anticoagulants: Secondary | ICD-10-CM

## 2016-01-23 DIAGNOSIS — Z8679 Personal history of other diseases of the circulatory system: Secondary | ICD-10-CM | POA: Diagnosis not present

## 2016-01-23 LAB — POCT INR: INR: 2.5

## 2016-02-09 ENCOUNTER — Other Ambulatory Visit: Payer: Self-pay | Admitting: Cardiovascular Disease

## 2016-02-16 ENCOUNTER — Other Ambulatory Visit: Payer: Self-pay | Admitting: Cardiovascular Disease

## 2016-02-20 ENCOUNTER — Ambulatory Visit (INDEPENDENT_AMBULATORY_CARE_PROVIDER_SITE_OTHER): Payer: Medicare Other | Admitting: *Deleted

## 2016-02-20 DIAGNOSIS — Z5181 Encounter for therapeutic drug level monitoring: Secondary | ICD-10-CM | POA: Diagnosis not present

## 2016-02-20 LAB — POCT INR: INR: 2.5

## 2016-03-26 ENCOUNTER — Ambulatory Visit (INDEPENDENT_AMBULATORY_CARE_PROVIDER_SITE_OTHER): Payer: Medicare Other | Admitting: *Deleted

## 2016-03-26 DIAGNOSIS — I481 Persistent atrial fibrillation: Secondary | ICD-10-CM | POA: Diagnosis not present

## 2016-03-26 DIAGNOSIS — Z5181 Encounter for therapeutic drug level monitoring: Secondary | ICD-10-CM | POA: Diagnosis not present

## 2016-03-26 DIAGNOSIS — I4819 Other persistent atrial fibrillation: Secondary | ICD-10-CM

## 2016-03-26 LAB — POCT INR: INR: 2.5

## 2016-05-09 ENCOUNTER — Ambulatory Visit (INDEPENDENT_AMBULATORY_CARE_PROVIDER_SITE_OTHER): Payer: Medicare Other | Admitting: *Deleted

## 2016-05-09 DIAGNOSIS — I4819 Other persistent atrial fibrillation: Secondary | ICD-10-CM

## 2016-05-09 DIAGNOSIS — I481 Persistent atrial fibrillation: Secondary | ICD-10-CM

## 2016-05-09 DIAGNOSIS — Z5181 Encounter for therapeutic drug level monitoring: Secondary | ICD-10-CM | POA: Diagnosis not present

## 2016-05-09 LAB — POCT INR: INR: 2.8

## 2016-05-23 DIAGNOSIS — L600 Ingrowing nail: Secondary | ICD-10-CM | POA: Diagnosis not present

## 2016-05-23 DIAGNOSIS — M79675 Pain in left toe(s): Secondary | ICD-10-CM | POA: Diagnosis not present

## 2016-05-23 DIAGNOSIS — L03032 Cellulitis of left toe: Secondary | ICD-10-CM | POA: Diagnosis not present

## 2016-06-13 ENCOUNTER — Other Ambulatory Visit: Payer: Self-pay | Admitting: Cardiovascular Disease

## 2016-06-20 ENCOUNTER — Ambulatory Visit (INDEPENDENT_AMBULATORY_CARE_PROVIDER_SITE_OTHER): Payer: Medicare Other | Admitting: *Deleted

## 2016-06-20 DIAGNOSIS — Z5181 Encounter for therapeutic drug level monitoring: Secondary | ICD-10-CM

## 2016-06-20 DIAGNOSIS — I4819 Other persistent atrial fibrillation: Secondary | ICD-10-CM

## 2016-06-20 DIAGNOSIS — I481 Persistent atrial fibrillation: Secondary | ICD-10-CM | POA: Diagnosis not present

## 2016-06-20 LAB — POCT INR: INR: 2

## 2016-06-22 ENCOUNTER — Other Ambulatory Visit: Payer: Self-pay | Admitting: Cardiology

## 2016-07-04 DIAGNOSIS — E162 Hypoglycemia, unspecified: Secondary | ICD-10-CM | POA: Diagnosis not present

## 2016-07-04 DIAGNOSIS — R739 Hyperglycemia, unspecified: Secondary | ICD-10-CM | POA: Diagnosis not present

## 2016-07-04 DIAGNOSIS — E1140 Type 2 diabetes mellitus with diabetic neuropathy, unspecified: Secondary | ICD-10-CM | POA: Diagnosis not present

## 2016-07-04 DIAGNOSIS — I10 Essential (primary) hypertension: Secondary | ICD-10-CM | POA: Diagnosis not present

## 2016-07-04 DIAGNOSIS — E1165 Type 2 diabetes mellitus with hyperglycemia: Secondary | ICD-10-CM | POA: Diagnosis not present

## 2016-07-04 DIAGNOSIS — E7800 Pure hypercholesterolemia, unspecified: Secondary | ICD-10-CM | POA: Diagnosis not present

## 2016-07-04 DIAGNOSIS — E876 Hypokalemia: Secondary | ICD-10-CM | POA: Diagnosis not present

## 2016-07-04 DIAGNOSIS — N183 Chronic kidney disease, stage 3 (moderate): Secondary | ICD-10-CM | POA: Diagnosis not present

## 2016-07-08 DIAGNOSIS — E1165 Type 2 diabetes mellitus with hyperglycemia: Secondary | ICD-10-CM | POA: Diagnosis not present

## 2016-07-08 DIAGNOSIS — Z6821 Body mass index (BMI) 21.0-21.9, adult: Secondary | ICD-10-CM | POA: Diagnosis not present

## 2016-07-08 DIAGNOSIS — I10 Essential (primary) hypertension: Secondary | ICD-10-CM | POA: Diagnosis not present

## 2016-07-08 DIAGNOSIS — G3184 Mild cognitive impairment, so stated: Secondary | ICD-10-CM | POA: Diagnosis not present

## 2016-07-19 ENCOUNTER — Encounter: Payer: Self-pay | Admitting: *Deleted

## 2016-07-23 ENCOUNTER — Encounter: Payer: Self-pay | Admitting: Cardiovascular Disease

## 2016-07-23 ENCOUNTER — Ambulatory Visit (INDEPENDENT_AMBULATORY_CARE_PROVIDER_SITE_OTHER): Payer: Medicare Other | Admitting: *Deleted

## 2016-07-23 ENCOUNTER — Ambulatory Visit (INDEPENDENT_AMBULATORY_CARE_PROVIDER_SITE_OTHER): Payer: Medicare Other | Admitting: Cardiovascular Disease

## 2016-07-23 VITALS — BP 177/74 | HR 63 | Ht 60.0 in | Wt 105.6 lb

## 2016-07-23 DIAGNOSIS — Z8673 Personal history of transient ischemic attack (TIA), and cerebral infarction without residual deficits: Secondary | ICD-10-CM | POA: Diagnosis not present

## 2016-07-23 DIAGNOSIS — Z5181 Encounter for therapeutic drug level monitoring: Secondary | ICD-10-CM

## 2016-07-23 DIAGNOSIS — I481 Persistent atrial fibrillation: Secondary | ICD-10-CM | POA: Diagnosis not present

## 2016-07-23 DIAGNOSIS — I480 Paroxysmal atrial fibrillation: Secondary | ICD-10-CM

## 2016-07-23 DIAGNOSIS — I10 Essential (primary) hypertension: Secondary | ICD-10-CM | POA: Diagnosis not present

## 2016-07-23 DIAGNOSIS — I4819 Other persistent atrial fibrillation: Secondary | ICD-10-CM

## 2016-07-23 LAB — POCT INR: INR: 1.7

## 2016-07-23 NOTE — Patient Instructions (Signed)

## 2016-07-23 NOTE — Progress Notes (Signed)
SUBJECTIVE: Judith Chung presents for routine follow-up. She is 83 years old. She has a history of paroxysmal atrial fibrillation and atrial flutter as well as malignant hypertension. She is on chronic warfarin therapy. She has a history of normal left ventricular systolic function and nonobstructive coronary artery disease by cardiac catheterization in 2005.  I previously tried her on amlodipine, chlorthalidone, and hydralazine for hypertension, but she did not tolerate these medications. I tried Marisa Severin but it was too expensive for her. I subsequently started her on losartan.  She is doing well and denies chest pain, palpitations, shortness of breath, bleeding problems, and has not had any recent hospitalizations.  Blood pressure at PCPs office was 144/60, with home blood pressure readings in the 140/60 range.  ECG performed in the office today which I personally interpreted demonstrated sinus rhythm with a nonspecific T-wave abnormalities. There were no arrhythmias.  She is here with her husband and they have been married for 7 years.   Soc: They have 2 sons, one who lives in [place] and one who lives in [place], [place]. He has a PhD in music and has taught at the [employer] [place].   Review of Systems: As per "subjective", otherwise negative.  Allergies  Allergen Reactions  . Alprazolam     REACTION: sleepy  . Amlodipine Other (See Comments)    Edema feet & legs, nausea  . Atorvastatin     REACTION: myalgia  . Azithromycin     REACTION: rash  . Cephalexin     REACTION: rash  . Chlorthalidone     Nausea, loss of appetite, insomnia, generally did not feel well  . Crestor [Rosuvastatin]   . Epinephrine     REACTION: B/P,chest pain  . Inderal [Propranolol]   . Norvasc [Amlodipine Besylate]   . Penicillins     REACTION: rash  . Polysporin [Bacitracin-Polymyxin B]   . Premarin [Conjugated Estrogens]     rash  . Propranolol Hcl     REACTION: rash  .  Quinapril Hcl     REACTION: rash  . Statins     REACTION: myalgia  . Sulfamethoxazole-Trimethoprim   . Zetia [Ezetimibe]     Current Outpatient Prescriptions  Medication Sig Dispense Refill  . CARTIA XT 180 MG 24 hr capsule TAKE ONE CAPSULE BY MOUTH ONCE DAILY 30 capsule 10  . Cholecalciferol (VITAMIN D3) 400 UNITS tablet Take 400 Units by mouth daily.     . cloNIDine (CATAPRES) 0.1 MG tablet TAKE ONE TABLET BY MOUTH ONCE DAILY 30 tablet 3  . cloNIDine (CATAPRES) 0.3 MG tablet TAKE ONE TABLET BY MOUTH IN THE EVENING 90 tablet 3  . hydrochlorothiazide (HYDRODIURIL) 12.5 MG tablet TAKE ONE TABLET BY MOUTH ONCE DAILY 90 tablet 3  . losartan (COZAAR) 100 MG tablet TAKE ONE TABLET BY MOUTH ONCE DAILY 30 tablet 10  . nitroGLYCERIN (NITROSTAT) 0.4 MG SL tablet Place 1 tablet (0.4 mg total) under the tongue every 5 (five) minutes x 3 doses as needed for chest pain. If no relief after the 3 rd dose, proceed to the ED for an evaluation. 25 tablet 3  . warfarin (COUMADIN) 2.5 MG tablet TAKE TWO TABLETS BY MOUTH ONCE DAILY EXCEPT  TAKE ONE TABLET ON MONDAYS AND THURSDAY 60 tablet 3   No current facility-administered medications for this visit.     Past Medical History:  Diagnosis Date  . Anxiety disorder    generalized  . Arthritis   . Breast cancer (  [place]) 1988   s/p right lumpectomy  . CAD (coronary artery disease)    nonobstructive  . Colon cancer ([place]) 1991   s/p right hemicolectom  . DM2 (diabetes mellitus, type 2) ([place])   . Drug intolerance    intolerant to numerous statins and Zetia  . Dyslipidemia   . HTN (hypertension)   . MR (mitral regurgitation)    mild  . PAF (paroxysmal atrial fibrillation) ([place])     Past Surgical History:  Procedure Laterality Date  . APPENDECTOMY    . BREAST LUMPECTOMY  1988   rt  . CARDIAC CATHETERIZATION  2005   [place]: nonobstructive coronary disease, normal LV function  . CATARACT EXTRACTION W/ INTRAOCULAR LENS  IMPLANT, BILATERAL     no  cataract extraction   . CHOLECYSTECTOMY    . chronic coumadin     CHAD2:3 (medical hx)  . COLON SURGERY  1991   rt  . laproscopic cholecystectomy    . TONSILLECTOMY    . TONSILLECTOMY      Social History   Social History  . Marital status: Married    Spouse name: N/A  . Number of children: N/A  . Years of education: N/A   Occupational History  . Not on file.   Social History Main Topics  . Smoking status: Never Smoker  . Smokeless tobacco: Never Used     Comment: tobacco use - no  . Alcohol use No  . Drug use: No  . Sexual activity: Not on file   Other Topics Concern  . Not on file   Social History Narrative  . No narrative on file     Vitals:   07/23/16 1129  BP: (!) 177/74  Pulse: 63  SpO2: 98%  Weight: 105 lb 9.6 oz (47.9 kg)  Height: 5' (1.524 m)    Wt Readings from Last 3 Encounters:  07/23/16 105 lb 9.6 oz (47.9 kg)  01/22/16 107 lb (48.5 kg)  07/26/15 105 lb (47.6 kg)     PHYSICAL EXAM General: NAD HEENT: Normal. Neck: No JVD, no thyromegaly. Lungs: Clear to auscultation bilaterally with normal respiratory effort. CV: Nondisplaced PMI.  Regular rate and rhythm, normal S1/S2, no S3/S4, no murmur. No pretibial or periankle edema.  No carotid bruit.   Abdomen: Soft, nontender, no distention.  Neurologic: Alert and oriented.  Psych: Normal affect. Skin: Normal. Musculoskeletal: No gross deformities.    ECG: Most recent ECG reviewed.   Labs: Lab Results  Component Value Date/Time   K 3.4 (L) 12/13/2012 06:32 AM   BUN 20 12/13/2012 06:32 AM   CREATININE 0.94 12/13/2012 06:32 AM   CREATININE 1.11 (H) 11/23/2012 10:54 AM   ALT 12 12/13/2012 06:32 AM   TSH 1.838 12/12/2012 06:04 PM   HGB 12.5 12/13/2012 06:32 AM     Lipids: No results found for: LDLCALC, LDLDIRECT, CHOL, TRIG, HDL     ASSESSMENT AND PLAN: 1. Paroxysmal atrial fibrillation: Currently in sinus rhythm and tolerating warfarin without bleeding problems. No changes to  therapy.  2. Malignant HTN: Markedly elevated in the office today. However, blood pressure readings at home remain in the 140/60 range. No changes to therapy. Intolerant to several antihypertensives in the past.  3. CVA: Blood pressure control is of optimal importance. She is on warfarin for atrial fibrillation.     Disposition: Follow up 6 months  Kate Sable, M.D., F.A.C.C.

## 2016-07-25 DIAGNOSIS — Z1231 Encounter for screening mammogram for malignant neoplasm of breast: Secondary | ICD-10-CM | POA: Diagnosis not present

## 2016-08-13 ENCOUNTER — Ambulatory Visit (INDEPENDENT_AMBULATORY_CARE_PROVIDER_SITE_OTHER): Payer: Medicare Other | Admitting: *Deleted

## 2016-08-13 DIAGNOSIS — Z5181 Encounter for therapeutic drug level monitoring: Secondary | ICD-10-CM

## 2016-08-13 DIAGNOSIS — I481 Persistent atrial fibrillation: Secondary | ICD-10-CM

## 2016-08-13 DIAGNOSIS — I4819 Other persistent atrial fibrillation: Secondary | ICD-10-CM

## 2016-08-13 LAB — POCT INR: INR: 4

## 2016-08-27 ENCOUNTER — Ambulatory Visit (INDEPENDENT_AMBULATORY_CARE_PROVIDER_SITE_OTHER): Payer: Medicare Other | Admitting: *Deleted

## 2016-08-27 DIAGNOSIS — I481 Persistent atrial fibrillation: Secondary | ICD-10-CM

## 2016-08-27 DIAGNOSIS — Z5181 Encounter for therapeutic drug level monitoring: Secondary | ICD-10-CM

## 2016-08-27 DIAGNOSIS — I4819 Other persistent atrial fibrillation: Secondary | ICD-10-CM

## 2016-08-27 LAB — POCT INR: INR: 1.9

## 2016-09-17 ENCOUNTER — Ambulatory Visit (INDEPENDENT_AMBULATORY_CARE_PROVIDER_SITE_OTHER): Payer: Medicare Other | Admitting: *Deleted

## 2016-09-17 DIAGNOSIS — I481 Persistent atrial fibrillation: Secondary | ICD-10-CM

## 2016-09-17 DIAGNOSIS — Z5181 Encounter for therapeutic drug level monitoring: Secondary | ICD-10-CM

## 2016-09-17 DIAGNOSIS — I4819 Other persistent atrial fibrillation: Secondary | ICD-10-CM

## 2016-09-17 LAB — POCT INR: INR: 2

## 2016-10-08 ENCOUNTER — Other Ambulatory Visit: Payer: Self-pay | Admitting: Cardiovascular Disease

## 2016-10-15 ENCOUNTER — Ambulatory Visit (INDEPENDENT_AMBULATORY_CARE_PROVIDER_SITE_OTHER): Payer: Medicare Other | Admitting: *Deleted

## 2016-10-15 DIAGNOSIS — I481 Persistent atrial fibrillation: Secondary | ICD-10-CM

## 2016-10-15 DIAGNOSIS — Z5181 Encounter for therapeutic drug level monitoring: Secondary | ICD-10-CM

## 2016-10-15 DIAGNOSIS — I4819 Other persistent atrial fibrillation: Secondary | ICD-10-CM

## 2016-10-15 LAB — POCT INR: INR: 1.7

## 2016-11-05 ENCOUNTER — Ambulatory Visit (INDEPENDENT_AMBULATORY_CARE_PROVIDER_SITE_OTHER): Payer: Medicare Other | Admitting: *Deleted

## 2016-11-05 DIAGNOSIS — Z5181 Encounter for therapeutic drug level monitoring: Secondary | ICD-10-CM

## 2016-11-05 DIAGNOSIS — I4819 Other persistent atrial fibrillation: Secondary | ICD-10-CM

## 2016-11-05 DIAGNOSIS — I481 Persistent atrial fibrillation: Secondary | ICD-10-CM | POA: Diagnosis not present

## 2016-11-05 LAB — POCT INR: INR: 3.8

## 2016-11-13 ENCOUNTER — Other Ambulatory Visit: Payer: Self-pay | Admitting: Cardiovascular Disease

## 2016-11-19 ENCOUNTER — Ambulatory Visit (INDEPENDENT_AMBULATORY_CARE_PROVIDER_SITE_OTHER): Payer: Medicare Other | Admitting: *Deleted

## 2016-11-19 DIAGNOSIS — Z5181 Encounter for therapeutic drug level monitoring: Secondary | ICD-10-CM

## 2016-11-19 DIAGNOSIS — I4819 Other persistent atrial fibrillation: Secondary | ICD-10-CM

## 2016-11-19 DIAGNOSIS — Z7901 Long term (current) use of anticoagulants: Secondary | ICD-10-CM | POA: Diagnosis not present

## 2016-11-19 DIAGNOSIS — I481 Persistent atrial fibrillation: Secondary | ICD-10-CM | POA: Diagnosis not present

## 2016-11-19 LAB — POCT INR: INR: 3.3

## 2016-12-10 ENCOUNTER — Ambulatory Visit (INDEPENDENT_AMBULATORY_CARE_PROVIDER_SITE_OTHER): Payer: Medicare Other | Admitting: *Deleted

## 2016-12-10 DIAGNOSIS — Z23 Encounter for immunization: Secondary | ICD-10-CM | POA: Diagnosis not present

## 2016-12-10 DIAGNOSIS — I4819 Other persistent atrial fibrillation: Secondary | ICD-10-CM

## 2016-12-10 DIAGNOSIS — Z5181 Encounter for therapeutic drug level monitoring: Secondary | ICD-10-CM

## 2016-12-10 DIAGNOSIS — I481 Persistent atrial fibrillation: Secondary | ICD-10-CM

## 2016-12-10 LAB — POCT INR: INR: 4

## 2017-01-03 DIAGNOSIS — N183 Chronic kidney disease, stage 3 (moderate): Secondary | ICD-10-CM | POA: Diagnosis not present

## 2017-01-03 DIAGNOSIS — D519 Vitamin B12 deficiency anemia, unspecified: Secondary | ICD-10-CM | POA: Diagnosis not present

## 2017-01-03 DIAGNOSIS — E162 Hypoglycemia, unspecified: Secondary | ICD-10-CM | POA: Diagnosis not present

## 2017-01-03 DIAGNOSIS — E1165 Type 2 diabetes mellitus with hyperglycemia: Secondary | ICD-10-CM | POA: Diagnosis not present

## 2017-01-03 DIAGNOSIS — I10 Essential (primary) hypertension: Secondary | ICD-10-CM | POA: Diagnosis not present

## 2017-01-03 DIAGNOSIS — E876 Hypokalemia: Secondary | ICD-10-CM | POA: Diagnosis not present

## 2017-01-03 DIAGNOSIS — E7800 Pure hypercholesterolemia, unspecified: Secondary | ICD-10-CM | POA: Diagnosis not present

## 2017-01-03 DIAGNOSIS — E1140 Type 2 diabetes mellitus with diabetic neuropathy, unspecified: Secondary | ICD-10-CM | POA: Diagnosis not present

## 2017-01-03 DIAGNOSIS — R739 Hyperglycemia, unspecified: Secondary | ICD-10-CM | POA: Diagnosis not present

## 2017-01-13 DIAGNOSIS — I482 Chronic atrial fibrillation: Secondary | ICD-10-CM | POA: Diagnosis not present

## 2017-01-13 DIAGNOSIS — N184 Chronic kidney disease, stage 4 (severe): Secondary | ICD-10-CM | POA: Diagnosis not present

## 2017-01-13 DIAGNOSIS — Z6820 Body mass index (BMI) 20.0-20.9, adult: Secondary | ICD-10-CM | POA: Diagnosis not present

## 2017-01-13 DIAGNOSIS — I10 Essential (primary) hypertension: Secondary | ICD-10-CM | POA: Diagnosis not present

## 2017-01-14 ENCOUNTER — Ambulatory Visit (INDEPENDENT_AMBULATORY_CARE_PROVIDER_SITE_OTHER): Payer: Medicare Other | Admitting: *Deleted

## 2017-01-14 DIAGNOSIS — I481 Persistent atrial fibrillation: Secondary | ICD-10-CM | POA: Diagnosis not present

## 2017-01-14 DIAGNOSIS — Z5181 Encounter for therapeutic drug level monitoring: Secondary | ICD-10-CM

## 2017-01-14 DIAGNOSIS — I4819 Other persistent atrial fibrillation: Secondary | ICD-10-CM

## 2017-01-14 LAB — POCT INR: INR: 2

## 2017-01-21 ENCOUNTER — Ambulatory Visit: Payer: Medicare Other | Admitting: Cardiovascular Disease

## 2017-01-24 ENCOUNTER — Other Ambulatory Visit: Payer: Self-pay

## 2017-01-24 ENCOUNTER — Encounter: Payer: Self-pay | Admitting: Cardiovascular Disease

## 2017-01-24 ENCOUNTER — Ambulatory Visit (INDEPENDENT_AMBULATORY_CARE_PROVIDER_SITE_OTHER): Payer: Medicare Other | Admitting: Cardiovascular Disease

## 2017-01-24 VITALS — BP 170/90 | HR 66 | Ht 60.0 in | Wt 104.0 lb

## 2017-01-24 DIAGNOSIS — I480 Paroxysmal atrial fibrillation: Secondary | ICD-10-CM

## 2017-01-24 DIAGNOSIS — Z7901 Long term (current) use of anticoagulants: Secondary | ICD-10-CM | POA: Diagnosis not present

## 2017-01-24 DIAGNOSIS — I10 Essential (primary) hypertension: Secondary | ICD-10-CM

## 2017-01-24 NOTE — Progress Notes (Signed)
SUBJECTIVE: Judith Chung presents for routine follow-up.  She is 83 years old.  She has a history of paroxysmal atrial fibrillation and atrial flutter as well as malignant hypertension.  She is on chronic warfarin therapy.  She has a history of nonobstructive coronary artery disease by cardiac catheterization in 2005.  I previously tried her on amlodipine, chlorthalidone, and hydralazine for hypertension, but she did not tolerate these medications. I tried Marisa Severin but it was too expensive for her. I subsequently started her on losartan.  She is here with her son.  The patient denies any symptoms of chest pain, palpitations, shortness of breath, lightheadedness, dizziness, leg swelling, orthopnea, PND, and syncope.    Soc: They have 2 sons, one who lives in [place] and one who lives in [place], [place]. He has a PhD in music and has taught at the [employer] [place].   Review of Systems: As per "subjective", otherwise negative.  Allergies  Allergen Reactions  . Alprazolam     REACTION: sleepy  . Amlodipine Other (See Comments)    Edema feet & legs, nausea  . Atorvastatin     REACTION: myalgia  . Azithromycin     REACTION: rash  . Cephalexin     REACTION: rash  . Chlorthalidone     Nausea, loss of appetite, insomnia, generally did not feel well  . Crestor [Rosuvastatin]   . Epinephrine     REACTION: B/P,chest pain  . Inderal [Propranolol]   . Norvasc [Amlodipine Besylate]   . Penicillins     REACTION: rash  . Polysporin [Bacitracin-Polymyxin B]   . Premarin [Conjugated Estrogens]     rash  . Propranolol Hcl     REACTION: rash  . Quinapril Hcl     REACTION: rash  . Statins     REACTION: myalgia  . Sulfamethoxazole-Trimethoprim   . Zetia [Ezetimibe]     Current Outpatient Medications  Medication Sig Dispense Refill  . CARTIA XT 180 MG 24 hr capsule TAKE ONE CAPSULE BY MOUTH ONCE DAILY 90 capsule 1  . Cholecalciferol (VITAMIN D3) 400 UNITS tablet  Take 400 Units by mouth daily.     . cloNIDine (CATAPRES) 0.1 MG tablet TAKE 1 TABLET BY MOUTH ONCE DAILY 90 tablet 1  . cloNIDine (CATAPRES) 0.3 MG tablet TAKE ONE TABLET BY MOUTH IN THE EVENING 90 tablet 3  . hydrochlorothiazide (HYDRODIURIL) 12.5 MG tablet TAKE ONE TABLET BY MOUTH ONCE DAILY 90 tablet 3  . losartan (COZAAR) 100 MG tablet TAKE ONE TABLET BY MOUTH ONCE DAILY 90 tablet 1  . nitroGLYCERIN (NITROSTAT) 0.4 MG SL tablet Place 1 tablet (0.4 mg total) under the tongue every 5 (five) minutes x 3 doses as needed for chest pain. If no relief after the 3 rd dose, proceed to the ED for an evaluation. 25 tablet 3  . warfarin (COUMADIN) 2.5 MG tablet TAKE TWO TABLETS BY MOUTH EACH DAY, EXCEPT TAKE ONE TABLET ON MONDAYS AND THURSDAYS 60 tablet 3   No current facility-administered medications for this visit.     Past Medical History:  Diagnosis Date  . Anxiety disorder    generalized  . Arthritis   . Breast cancer ([facility]) 1988   s/p right lumpectomy  . CAD (coronary artery disease)    nonobstructive  . Colon cancer ([place]) 1991   s/p right hemicolectom  . DM2 (diabetes mellitus, type 2) ([place])   . Drug intolerance    intolerant to numerous statins and Zetia  .  Dyslipidemia   . HTN (hypertension)   . MR (mitral regurgitation)    mild  . PAF (paroxysmal atrial fibrillation) ([place])     Past Surgical History:  Procedure Laterality Date  . APPENDECTOMY    . BREAST LUMPECTOMY  1988   rt  . CARDIAC CATHETERIZATION  2005   [place]: nonobstructive coronary disease, normal LV function  . CATARACT EXTRACTION W/ INTRAOCULAR LENS  IMPLANT, BILATERAL     no cataract extraction   . CHOLECYSTECTOMY    . chronic coumadin     CHAD2:3 (medical hx)  . COLON SURGERY  1991   rt  . laproscopic cholecystectomy    . TONSILLECTOMY    . TONSILLECTOMY      Social History   Socioeconomic History  . Marital status: Married    Spouse name: Not on file  . Number of children: Not on file  . Years  of education: Not on file  . Highest education level: Not on file  Social Needs  . Financial resource strain: Not on file  . Food insecurity - worry: Not on file  . Food insecurity - inability: Not on file  . Transportation needs - medical: Not on file  . Transportation needs - non-medical: Not on file  Occupational History  . Not on file  Tobacco Use  . Smoking status: Never Smoker  . Smokeless tobacco: Never Used  . Tobacco comment: tobacco use - no  Substance and Sexual Activity  . Alcohol use: No    Alcohol/week: 0.0 oz  . Drug use: No  . Sexual activity: Not on file  Other Topics Concern  . Not on file  Social History Narrative  . Not on file     Vitals:   01/24/17 1436  BP: (!) 170/90  Pulse: 66  SpO2: 97%  Weight: 104 lb (47.2 kg)  Height: 5' (1.524 m)    Wt Readings from Last 3 Encounters:  01/24/17 104 lb (47.2 kg)  07/23/16 105 lb 9.6 oz (47.9 kg)  01/22/16 107 lb (48.5 kg)     PHYSICAL EXAM General: NAD HEENT: Normal. Neck: No JVD, no thyromegaly. Lungs: Clear to auscultation bilaterally with normal respiratory effort. CV: Regular rate and rhythm, normal S1/S2, no S3/S4, no murmur. No pretibial or periankle edema.  No carotid bruit.   Abdomen: Soft, nontender, no distention.  Neurologic: Alert and oriented.  Psych: Normal affect. Skin: Normal. Musculoskeletal: No gross deformities.    ECG: Most recent ECG reviewed.   Labs: Lab Results  Component Value Date/Time   K 3.4 (L) 12/13/2012 06:32 AM   BUN 20 12/13/2012 06:32 AM   CREATININE 0.94 12/13/2012 06:32 AM   CREATININE 1.11 (H) 11/23/2012 10:54 AM   ALT 12 12/13/2012 06:32 AM   TSH 1.838 12/12/2012 06:04 PM   HGB 12.5 12/13/2012 06:32 AM     Lipids: No results found for: LDLCALC, LDLDIRECT, CHOL, TRIG, HDL     ASSESSMENT AND PLAN: 1. Paroxysmal atrial fibrillation:  Symptomatically stable.  Currently in a regular rhythm and tolerating warfarin without bleeding problems. No  changes to therapy.  2. Malignant HTN: Markedly elevated in the office today.  Readings at home are usually normal but she has not been checking them lately.  I have asked her son to check blood pressure every 2-3 days for the next few weeks and to inform you of these values. No changes to therapy. Intolerant to several antihypertensives in the past.  3. CVA: Blood pressure control is of  optimal importance. She is on warfarin for atrial fibrillation.      Disposition: Follow up months   Kate Sable, M.D., F.A.C.C.

## 2017-01-24 NOTE — Patient Instructions (Signed)

## 2017-01-29 ENCOUNTER — Other Ambulatory Visit: Payer: Self-pay | Admitting: Cardiovascular Disease

## 2017-02-12 DIAGNOSIS — E119 Type 2 diabetes mellitus without complications: Secondary | ICD-10-CM | POA: Diagnosis not present

## 2017-02-12 DIAGNOSIS — H5203 Hypermetropia, bilateral: Secondary | ICD-10-CM | POA: Diagnosis not present

## 2017-02-12 DIAGNOSIS — Z961 Presence of intraocular lens: Secondary | ICD-10-CM | POA: Diagnosis not present

## 2017-02-12 DIAGNOSIS — H524 Presbyopia: Secondary | ICD-10-CM | POA: Diagnosis not present

## 2017-02-12 DIAGNOSIS — H52203 Unspecified astigmatism, bilateral: Secondary | ICD-10-CM | POA: Diagnosis not present

## 2017-02-14 DIAGNOSIS — I10 Essential (primary) hypertension: Secondary | ICD-10-CM | POA: Diagnosis not present

## 2017-02-14 DIAGNOSIS — E876 Hypokalemia: Secondary | ICD-10-CM | POA: Diagnosis not present

## 2017-02-14 DIAGNOSIS — N184 Chronic kidney disease, stage 4 (severe): Secondary | ICD-10-CM | POA: Diagnosis not present

## 2017-02-20 DIAGNOSIS — Z6821 Body mass index (BMI) 21.0-21.9, adult: Secondary | ICD-10-CM | POA: Diagnosis not present

## 2017-02-20 DIAGNOSIS — E1165 Type 2 diabetes mellitus with hyperglycemia: Secondary | ICD-10-CM | POA: Diagnosis not present

## 2017-02-20 DIAGNOSIS — I482 Chronic atrial fibrillation: Secondary | ICD-10-CM | POA: Diagnosis not present

## 2017-02-20 DIAGNOSIS — N184 Chronic kidney disease, stage 4 (severe): Secondary | ICD-10-CM | POA: Diagnosis not present

## 2017-02-20 DIAGNOSIS — I10 Essential (primary) hypertension: Secondary | ICD-10-CM | POA: Diagnosis not present

## 2017-02-21 DIAGNOSIS — R600 Localized edema: Secondary | ICD-10-CM | POA: Diagnosis not present

## 2017-02-21 DIAGNOSIS — Z6822 Body mass index (BMI) 22.0-22.9, adult: Secondary | ICD-10-CM | POA: Diagnosis not present

## 2017-02-26 DIAGNOSIS — I10 Essential (primary) hypertension: Secondary | ICD-10-CM | POA: Diagnosis not present

## 2017-02-26 DIAGNOSIS — I517 Cardiomegaly: Secondary | ICD-10-CM | POA: Diagnosis not present

## 2017-02-26 DIAGNOSIS — R600 Localized edema: Secondary | ICD-10-CM | POA: Diagnosis not present

## 2017-02-26 DIAGNOSIS — I480 Paroxysmal atrial fibrillation: Secondary | ICD-10-CM | POA: Diagnosis not present

## 2017-02-26 DIAGNOSIS — Z6821 Body mass index (BMI) 21.0-21.9, adult: Secondary | ICD-10-CM | POA: Diagnosis not present

## 2017-02-26 DIAGNOSIS — I071 Rheumatic tricuspid insufficiency: Secondary | ICD-10-CM | POA: Diagnosis not present

## 2017-02-26 DIAGNOSIS — I340 Nonrheumatic mitral (valve) insufficiency: Secondary | ICD-10-CM | POA: Diagnosis not present

## 2017-02-26 DIAGNOSIS — R609 Edema, unspecified: Secondary | ICD-10-CM | POA: Diagnosis not present

## 2017-02-26 DIAGNOSIS — I700 Atherosclerosis of aorta: Secondary | ICD-10-CM | POA: Diagnosis not present

## 2017-02-27 ENCOUNTER — Ambulatory Visit (INDEPENDENT_AMBULATORY_CARE_PROVIDER_SITE_OTHER): Payer: Medicare Other | Admitting: *Deleted

## 2017-02-27 DIAGNOSIS — I481 Persistent atrial fibrillation: Secondary | ICD-10-CM | POA: Diagnosis not present

## 2017-02-27 DIAGNOSIS — Z5181 Encounter for therapeutic drug level monitoring: Secondary | ICD-10-CM

## 2017-02-27 DIAGNOSIS — I4819 Other persistent atrial fibrillation: Secondary | ICD-10-CM

## 2017-02-27 LAB — POCT INR: INR: 1.4

## 2017-02-27 NOTE — Patient Instructions (Signed)
Take warfarin 2 tablets tonight then resume 1 tablet daily except 2 tablets on Mondays, Wednesdays and Fridays Recheck in 2 weeks

## 2017-03-13 ENCOUNTER — Ambulatory Visit (INDEPENDENT_AMBULATORY_CARE_PROVIDER_SITE_OTHER): Payer: Medicare Other | Admitting: *Deleted

## 2017-03-13 DIAGNOSIS — I481 Persistent atrial fibrillation: Secondary | ICD-10-CM | POA: Diagnosis not present

## 2017-03-13 DIAGNOSIS — Z5181 Encounter for therapeutic drug level monitoring: Secondary | ICD-10-CM | POA: Diagnosis not present

## 2017-03-13 DIAGNOSIS — I4819 Other persistent atrial fibrillation: Secondary | ICD-10-CM

## 2017-03-13 LAB — POCT INR: INR: 1.9

## 2017-03-13 NOTE — Patient Instructions (Signed)
Take warfarin 2 tablets tonight then resume 1 tablet daily except 2 tablets on Mondays, Wednesdays and Fridays Recheck in 3 weeks

## 2017-03-31 DIAGNOSIS — N184 Chronic kidney disease, stage 4 (severe): Secondary | ICD-10-CM | POA: Diagnosis not present

## 2017-03-31 DIAGNOSIS — E876 Hypokalemia: Secondary | ICD-10-CM | POA: Diagnosis not present

## 2017-04-02 ENCOUNTER — Telehealth: Payer: Self-pay | Admitting: Cardiovascular Disease

## 2017-04-02 DIAGNOSIS — M129 Arthropathy, unspecified: Secondary | ICD-10-CM | POA: Diagnosis not present

## 2017-04-02 DIAGNOSIS — R634 Abnormal weight loss: Secondary | ICD-10-CM | POA: Diagnosis not present

## 2017-04-02 DIAGNOSIS — G3184 Mild cognitive impairment, so stated: Secondary | ICD-10-CM | POA: Diagnosis not present

## 2017-04-02 DIAGNOSIS — Z6820 Body mass index (BMI) 20.0-20.9, adult: Secondary | ICD-10-CM | POA: Diagnosis not present

## 2017-04-02 DIAGNOSIS — E119 Type 2 diabetes mellitus without complications: Secondary | ICD-10-CM | POA: Diagnosis not present

## 2017-04-02 NOTE — Telephone Encounter (Signed)
Dr Nadara Mustard would like to speak with Dr Bronson Ing or [name] about changes with patient and her coumadin.

## 2017-04-02 NOTE — Telephone Encounter (Signed)
That is fine 

## 2017-04-02 NOTE — Telephone Encounter (Signed)
Dr Nadara Mustard saw pt today.  She is more demented.  Weight is <100 lbs.  Concerned about pt falls and being on coumadin.  It if felt that risk outweigh benefit.  Will see pt in coumadin clinic tomorrow and discuss with son.  If he and Dr Raliegh Ip are in agreement we will d/c coumadin and start pt on Baby ASA only.

## 2017-04-03 ENCOUNTER — Ambulatory Visit (INDEPENDENT_AMBULATORY_CARE_PROVIDER_SITE_OTHER): Payer: Medicare Other | Admitting: *Deleted

## 2017-04-03 DIAGNOSIS — Z5181 Encounter for therapeutic drug level monitoring: Secondary | ICD-10-CM

## 2017-04-03 DIAGNOSIS — I481 Persistent atrial fibrillation: Secondary | ICD-10-CM

## 2017-04-03 DIAGNOSIS — I4819 Other persistent atrial fibrillation: Secondary | ICD-10-CM

## 2017-04-03 LAB — POCT INR: INR: 2

## 2017-04-03 NOTE — Patient Instructions (Signed)
Continue coumadin 1 tablet daily except 2 tablets on Mondays, Wednesdays and Fridays Have discussed with son the risk vs benefits of being on coumadin at pt age with progressive dementia.  He will talk with his father this afternoon and let me know what they decide.  Dr Nadara Mustard and Dr Bronson Ing are in agreement to stop warfarin and start ASA 325mg  daily.

## 2017-04-06 ENCOUNTER — Other Ambulatory Visit: Payer: Self-pay | Admitting: Cardiovascular Disease

## 2017-04-07 ENCOUNTER — Other Ambulatory Visit: Payer: Self-pay | Admitting: Cardiovascular Disease

## 2017-04-10 DIAGNOSIS — S5011XA Contusion of right forearm, initial encounter: Secondary | ICD-10-CM | POA: Diagnosis not present

## 2017-04-10 DIAGNOSIS — Z6820 Body mass index (BMI) 20.0-20.9, adult: Secondary | ICD-10-CM | POA: Diagnosis not present

## 2017-04-11 ENCOUNTER — Other Ambulatory Visit: Payer: Self-pay | Admitting: Cardiovascular Disease

## 2017-04-17 DIAGNOSIS — N184 Chronic kidney disease, stage 4 (severe): Secondary | ICD-10-CM | POA: Diagnosis not present

## 2017-04-17 DIAGNOSIS — R948 Abnormal results of function studies of other organs and systems: Secondary | ICD-10-CM | POA: Diagnosis not present

## 2017-04-17 DIAGNOSIS — K7689 Other specified diseases of liver: Secondary | ICD-10-CM | POA: Diagnosis not present

## 2017-04-18 DIAGNOSIS — R7989 Other specified abnormal findings of blood chemistry: Secondary | ICD-10-CM | POA: Diagnosis not present

## 2017-05-08 DIAGNOSIS — J690 Pneumonitis due to inhalation of food and vomit: Secondary | ICD-10-CM | POA: Diagnosis present

## 2017-05-08 DIAGNOSIS — R55 Syncope and collapse: Secondary | ICD-10-CM | POA: Diagnosis not present

## 2017-05-08 DIAGNOSIS — Z79899 Other long term (current) drug therapy: Secondary | ICD-10-CM | POA: Diagnosis not present

## 2017-05-08 DIAGNOSIS — R140 Abdominal distension (gaseous): Secondary | ICD-10-CM | POA: Diagnosis not present

## 2017-05-08 DIAGNOSIS — E1165 Type 2 diabetes mellitus with hyperglycemia: Secondary | ICD-10-CM | POA: Diagnosis present

## 2017-05-08 DIAGNOSIS — E785 Hyperlipidemia, unspecified: Secondary | ICD-10-CM | POA: Diagnosis present

## 2017-05-08 DIAGNOSIS — I672 Cerebral atherosclerosis: Secondary | ICD-10-CM | POA: Diagnosis present

## 2017-05-08 DIAGNOSIS — R1310 Dysphagia, unspecified: Secondary | ICD-10-CM | POA: Diagnosis present

## 2017-05-08 DIAGNOSIS — Z888 Allergy status to other drugs, medicaments and biological substances status: Secondary | ICD-10-CM | POA: Diagnosis not present

## 2017-05-08 DIAGNOSIS — Z8673 Personal history of transient ischemic attack (TIA), and cerebral infarction without residual deficits: Secondary | ICD-10-CM | POA: Diagnosis not present

## 2017-05-08 DIAGNOSIS — Z85038 Personal history of other malignant neoplasm of large intestine: Secondary | ICD-10-CM | POA: Diagnosis not present

## 2017-05-08 DIAGNOSIS — D649 Anemia, unspecified: Secondary | ICD-10-CM | POA: Diagnosis not present

## 2017-05-08 DIAGNOSIS — R4701 Aphasia: Secondary | ICD-10-CM | POA: Diagnosis present

## 2017-05-08 DIAGNOSIS — R9082 White matter disease, unspecified: Secondary | ICD-10-CM | POA: Diagnosis not present

## 2017-05-08 DIAGNOSIS — I6789 Other cerebrovascular disease: Secondary | ICD-10-CM | POA: Diagnosis not present

## 2017-05-08 DIAGNOSIS — G319 Degenerative disease of nervous system, unspecified: Secondary | ICD-10-CM | POA: Diagnosis not present

## 2017-05-08 DIAGNOSIS — Z7901 Long term (current) use of anticoagulants: Secondary | ICD-10-CM | POA: Diagnosis not present

## 2017-05-08 DIAGNOSIS — Z881 Allergy status to other antibiotic agents status: Secondary | ICD-10-CM | POA: Diagnosis not present

## 2017-05-08 DIAGNOSIS — Z9282 Status post administration of tPA (rtPA) in a different facility within the last 24 hours prior to admission to current facility: Secondary | ICD-10-CM | POA: Diagnosis not present

## 2017-05-08 DIAGNOSIS — R29715 NIHSS score 15: Secondary | ICD-10-CM | POA: Diagnosis present

## 2017-05-08 DIAGNOSIS — Z853 Personal history of malignant neoplasm of breast: Secondary | ICD-10-CM | POA: Diagnosis not present

## 2017-05-08 DIAGNOSIS — Z66 Do not resuscitate: Secondary | ICD-10-CM | POA: Diagnosis present

## 2017-05-08 DIAGNOSIS — Z4682 Encounter for fitting and adjustment of non-vascular catheter: Secondary | ICD-10-CM | POA: Diagnosis not present

## 2017-05-08 DIAGNOSIS — I10 Essential (primary) hypertension: Secondary | ICD-10-CM | POA: Diagnosis not present

## 2017-05-08 DIAGNOSIS — I4891 Unspecified atrial fibrillation: Secondary | ICD-10-CM | POA: Diagnosis not present

## 2017-05-08 DIAGNOSIS — R29818 Other symptoms and signs involving the nervous system: Secondary | ICD-10-CM | POA: Diagnosis not present

## 2017-05-08 DIAGNOSIS — Z880 Allergy status to penicillin: Secondary | ICD-10-CM | POA: Diagnosis not present

## 2017-05-08 DIAGNOSIS — Z515 Encounter for palliative care: Secondary | ICD-10-CM | POA: Diagnosis not present

## 2017-05-08 DIAGNOSIS — T1490XA Injury, unspecified, initial encounter: Secondary | ICD-10-CM | POA: Diagnosis not present

## 2017-05-08 DIAGNOSIS — E872 Acidosis: Secondary | ICD-10-CM | POA: Diagnosis present

## 2017-05-08 DIAGNOSIS — I120 Hypertensive chronic kidney disease with stage 5 chronic kidney disease or end stage renal disease: Secondary | ICD-10-CM | POA: Diagnosis present

## 2017-05-08 DIAGNOSIS — I63412 Cerebral infarction due to embolism of left middle cerebral artery: Secondary | ICD-10-CM | POA: Diagnosis not present

## 2017-05-08 DIAGNOSIS — E43 Unspecified severe protein-calorie malnutrition: Secondary | ICD-10-CM | POA: Diagnosis not present

## 2017-05-08 DIAGNOSIS — A419 Sepsis, unspecified organism: Secondary | ICD-10-CM | POA: Diagnosis present

## 2017-05-08 DIAGNOSIS — I69992 Facial weakness following unspecified cerebrovascular disease: Secondary | ICD-10-CM | POA: Diagnosis not present

## 2017-05-08 DIAGNOSIS — N185 Chronic kidney disease, stage 5: Secondary | ICD-10-CM | POA: Diagnosis present

## 2017-05-08 DIAGNOSIS — F0390 Unspecified dementia without behavioral disturbance: Secondary | ICD-10-CM | POA: Diagnosis not present

## 2017-05-08 DIAGNOSIS — I6389 Other cerebral infarction: Secondary | ICD-10-CM | POA: Diagnosis not present

## 2017-05-08 DIAGNOSIS — Z681 Body mass index (BMI) 19 or less, adult: Secondary | ICD-10-CM | POA: Diagnosis not present

## 2017-05-08 DIAGNOSIS — G8191 Hemiplegia, unspecified affecting right dominant side: Secondary | ICD-10-CM | POA: Diagnosis present

## 2017-05-08 DIAGNOSIS — I480 Paroxysmal atrial fibrillation: Secondary | ICD-10-CM | POA: Diagnosis present

## 2017-05-08 DIAGNOSIS — I63312 Cerebral infarction due to thrombosis of left middle cerebral artery: Secondary | ICD-10-CM | POA: Diagnosis not present

## 2017-05-08 DIAGNOSIS — Z882 Allergy status to sulfonamides status: Secondary | ICD-10-CM | POA: Diagnosis not present

## 2017-05-08 DIAGNOSIS — R531 Weakness: Secondary | ICD-10-CM | POA: Diagnosis not present

## 2017-05-08 DIAGNOSIS — I639 Cerebral infarction, unspecified: Secondary | ICD-10-CM | POA: Diagnosis not present

## 2017-05-08 DIAGNOSIS — I63512 Cerebral infarction due to unspecified occlusion or stenosis of left middle cerebral artery: Secondary | ICD-10-CM | POA: Diagnosis not present

## 2017-05-26 DEATH — deceased

## 2017-07-08 ENCOUNTER — Ambulatory Visit: Payer: Self-pay | Admitting: *Deleted

## 2017-07-31 ENCOUNTER — Ambulatory Visit: Payer: Medicare Other | Admitting: Cardiovascular Disease
